# Patient Record
Sex: Male | Born: 1935 | Race: White | Hispanic: No | Smoking: Current every day smoker
Health system: Southern US, Community
[De-identification: ages and names within clinical notes are randomized; demographics above are authoritative.]

## PROBLEM LIST (undated history)

## (undated) DIAGNOSIS — I10 Essential (primary) hypertension: Secondary | ICD-10-CM

## (undated) DIAGNOSIS — C4490 Unspecified malignant neoplasm of skin, unspecified: Secondary | ICD-10-CM

## (undated) DIAGNOSIS — I70209 Unspecified atherosclerosis of native arteries of extremities, unspecified extremity: Secondary | ICD-10-CM

## (undated) DIAGNOSIS — E785 Hyperlipidemia, unspecified: Secondary | ICD-10-CM

## (undated) DIAGNOSIS — I6529 Occlusion and stenosis of unspecified carotid artery: Secondary | ICD-10-CM

## (undated) DIAGNOSIS — I119 Hypertensive heart disease without heart failure: Secondary | ICD-10-CM

## (undated) DIAGNOSIS — I739 Peripheral vascular disease, unspecified: Secondary | ICD-10-CM

## (undated) HISTORY — PX: CATARACT EXTRACTION: SUR2

## (undated) HISTORY — DX: Unspecified atherosclerosis of native arteries of extremities, unspecified extremity: I70.209

## (undated) HISTORY — DX: Unspecified malignant neoplasm of skin, unspecified: C44.90

## (undated) HISTORY — DX: Hyperlipidemia, unspecified: E78.5

## (undated) HISTORY — DX: Hypertensive heart disease without heart failure: I11.9

## (undated) HISTORY — DX: Peripheral vascular disease, unspecified: I73.9

## (undated) HISTORY — PX: INSERT / REPLACE / REMOVE PACEMAKER: SUR710

## (undated) HISTORY — PX: HERNIA REPAIR: SHX51

## (undated) HISTORY — DX: Occlusion and stenosis of unspecified carotid artery: I65.29

## (undated) HISTORY — DX: Essential (primary) hypertension: I10

## (undated) HISTORY — PX: PROSTATECTOMY: SHX69

## (undated) HISTORY — PX: APPENDECTOMY: SHX54

---

## 1998-09-23 ENCOUNTER — Ambulatory Visit (HOSPITAL_COMMUNITY): Admission: RE | Admit: 1998-09-23 | Discharge: 1998-09-23 | Payer: Self-pay | Admitting: Internal Medicine

## 2006-10-20 ENCOUNTER — Ambulatory Visit: Payer: Self-pay | Admitting: Oncology

## 2007-08-02 ENCOUNTER — Ambulatory Visit: Payer: Self-pay | Admitting: Internal Medicine

## 2007-08-02 DIAGNOSIS — R05 Cough: Secondary | ICD-10-CM | POA: Insufficient documentation

## 2007-08-02 DIAGNOSIS — C61 Malignant neoplasm of prostate: Secondary | ICD-10-CM

## 2007-08-02 DIAGNOSIS — J449 Chronic obstructive pulmonary disease, unspecified: Secondary | ICD-10-CM | POA: Insufficient documentation

## 2007-08-02 DIAGNOSIS — E785 Hyperlipidemia, unspecified: Secondary | ICD-10-CM | POA: Insufficient documentation

## 2007-08-15 ENCOUNTER — Telehealth (INDEPENDENT_AMBULATORY_CARE_PROVIDER_SITE_OTHER): Payer: Self-pay | Admitting: *Deleted

## 2007-08-17 ENCOUNTER — Telehealth: Payer: Self-pay | Admitting: Internal Medicine

## 2007-08-18 ENCOUNTER — Ambulatory Visit: Payer: Self-pay | Admitting: Internal Medicine

## 2007-09-23 ENCOUNTER — Ambulatory Visit: Payer: Self-pay | Admitting: Internal Medicine

## 2010-03-03 ENCOUNTER — Encounter: Payer: Self-pay | Admitting: Internal Medicine

## 2010-03-19 NOTE — Letter (Signed)
Summary: Arvin Collard II MD/[facility] MD/[facility]   Imported By: Lester Clayton 03/12/2010 07:29:07  _____________________________________________________________________  External Attachment:    Type:   Image     Comment:   External Document

## 2010-07-03 ENCOUNTER — Other Ambulatory Visit: Payer: Self-pay

## 2011-01-29 DIAGNOSIS — L570 Actinic keratosis: Secondary | ICD-10-CM | POA: Diagnosis not present

## 2011-02-17 DIAGNOSIS — C44711 Basal cell carcinoma of skin of unspecified lower limb, including hip: Secondary | ICD-10-CM | POA: Diagnosis not present

## 2011-03-17 DIAGNOSIS — I498 Other specified cardiac arrhythmias: Secondary | ICD-10-CM | POA: Diagnosis not present

## 2011-04-07 DIAGNOSIS — H353290 Exudative age-related macular degeneration, unspecified eye, stage unspecified: Secondary | ICD-10-CM | POA: Diagnosis not present

## 2011-04-20 DIAGNOSIS — I10 Essential (primary) hypertension: Secondary | ICD-10-CM | POA: Diagnosis not present

## 2011-04-20 DIAGNOSIS — J209 Acute bronchitis, unspecified: Secondary | ICD-10-CM | POA: Diagnosis not present

## 2011-04-22 DIAGNOSIS — E1149 Type 2 diabetes mellitus with other diabetic neurological complication: Secondary | ICD-10-CM | POA: Diagnosis not present

## 2011-04-22 DIAGNOSIS — L608 Other nail disorders: Secondary | ICD-10-CM | POA: Diagnosis not present

## 2011-04-23 DIAGNOSIS — C44711 Basal cell carcinoma of skin of unspecified lower limb, including hip: Secondary | ICD-10-CM | POA: Diagnosis not present

## 2011-04-23 DIAGNOSIS — L723 Sebaceous cyst: Secondary | ICD-10-CM | POA: Diagnosis not present

## 2011-04-23 DIAGNOSIS — L821 Other seborrheic keratosis: Secondary | ICD-10-CM | POA: Diagnosis not present

## 2011-05-05 DIAGNOSIS — C61 Malignant neoplasm of prostate: Secondary | ICD-10-CM | POA: Diagnosis not present

## 2011-05-11 DIAGNOSIS — Z79899 Other long term (current) drug therapy: Secondary | ICD-10-CM | POA: Diagnosis not present

## 2011-05-11 DIAGNOSIS — I10 Essential (primary) hypertension: Secondary | ICD-10-CM | POA: Diagnosis not present

## 2011-05-11 DIAGNOSIS — J449 Chronic obstructive pulmonary disease, unspecified: Secondary | ICD-10-CM | POA: Diagnosis not present

## 2011-05-11 DIAGNOSIS — R358 Other polyuria: Secondary | ICD-10-CM | POA: Diagnosis not present

## 2011-05-11 DIAGNOSIS — D510 Vitamin B12 deficiency anemia due to intrinsic factor deficiency: Secondary | ICD-10-CM | POA: Diagnosis not present

## 2011-05-13 DIAGNOSIS — C61 Malignant neoplasm of prostate: Secondary | ICD-10-CM | POA: Diagnosis not present

## 2011-05-13 DIAGNOSIS — L989 Disorder of the skin and subcutaneous tissue, unspecified: Secondary | ICD-10-CM | POA: Diagnosis not present

## 2011-05-13 DIAGNOSIS — L723 Sebaceous cyst: Secondary | ICD-10-CM | POA: Diagnosis not present

## 2011-05-13 DIAGNOSIS — Z85828 Personal history of other malignant neoplasm of skin: Secondary | ICD-10-CM | POA: Diagnosis not present

## 2011-05-19 DIAGNOSIS — H353290 Exudative age-related macular degeneration, unspecified eye, stage unspecified: Secondary | ICD-10-CM | POA: Diagnosis not present

## 2011-05-21 DIAGNOSIS — J029 Acute pharyngitis, unspecified: Secondary | ICD-10-CM | POA: Diagnosis not present

## 2011-05-25 DIAGNOSIS — L570 Actinic keratosis: Secondary | ICD-10-CM | POA: Diagnosis not present

## 2011-05-25 DIAGNOSIS — L0100 Impetigo, unspecified: Secondary | ICD-10-CM | POA: Diagnosis not present

## 2011-05-25 DIAGNOSIS — C44711 Basal cell carcinoma of skin of unspecified lower limb, including hip: Secondary | ICD-10-CM | POA: Diagnosis not present

## 2011-06-15 DIAGNOSIS — L570 Actinic keratosis: Secondary | ICD-10-CM | POA: Diagnosis not present

## 2011-06-15 DIAGNOSIS — R1900 Intra-abdominal and pelvic swelling, mass and lump, unspecified site: Secondary | ICD-10-CM | POA: Diagnosis not present

## 2011-06-15 DIAGNOSIS — K5900 Constipation, unspecified: Secondary | ICD-10-CM | POA: Diagnosis not present

## 2011-06-15 DIAGNOSIS — I10 Essential (primary) hypertension: Secondary | ICD-10-CM | POA: Diagnosis not present

## 2011-06-23 DIAGNOSIS — H353290 Exudative age-related macular degeneration, unspecified eye, stage unspecified: Secondary | ICD-10-CM | POA: Diagnosis not present

## 2011-06-25 DIAGNOSIS — E119 Type 2 diabetes mellitus without complications: Secondary | ICD-10-CM | POA: Diagnosis not present

## 2011-06-25 DIAGNOSIS — I10 Essential (primary) hypertension: Secondary | ICD-10-CM | POA: Diagnosis not present

## 2011-06-25 DIAGNOSIS — R1032 Left lower quadrant pain: Secondary | ICD-10-CM | POA: Diagnosis not present

## 2011-06-27 DIAGNOSIS — R7309 Other abnormal glucose: Secondary | ICD-10-CM | POA: Diagnosis not present

## 2011-07-09 DIAGNOSIS — Z950 Presence of cardiac pacemaker: Secondary | ICD-10-CM | POA: Diagnosis not present

## 2011-07-10 DIAGNOSIS — R221 Localized swelling, mass and lump, neck: Secondary | ICD-10-CM | POA: Diagnosis not present

## 2011-07-10 DIAGNOSIS — R220 Localized swelling, mass and lump, head: Secondary | ICD-10-CM | POA: Diagnosis not present

## 2011-07-13 DIAGNOSIS — I739 Peripheral vascular disease, unspecified: Secondary | ICD-10-CM | POA: Diagnosis not present

## 2011-07-13 DIAGNOSIS — I10 Essential (primary) hypertension: Secondary | ICD-10-CM | POA: Diagnosis not present

## 2011-07-13 DIAGNOSIS — Z950 Presence of cardiac pacemaker: Secondary | ICD-10-CM | POA: Diagnosis not present

## 2011-07-13 DIAGNOSIS — E785 Hyperlipidemia, unspecified: Secondary | ICD-10-CM | POA: Diagnosis not present

## 2011-07-21 DIAGNOSIS — H353290 Exudative age-related macular degeneration, unspecified eye, stage unspecified: Secondary | ICD-10-CM | POA: Diagnosis not present

## 2011-08-03 DIAGNOSIS — H5710 Ocular pain, unspecified eye: Secondary | ICD-10-CM | POA: Diagnosis not present

## 2011-08-06 DIAGNOSIS — L608 Other nail disorders: Secondary | ICD-10-CM | POA: Diagnosis not present

## 2011-08-06 DIAGNOSIS — E1149 Type 2 diabetes mellitus with other diabetic neurological complication: Secondary | ICD-10-CM | POA: Diagnosis not present

## 2011-08-25 DIAGNOSIS — H353290 Exudative age-related macular degeneration, unspecified eye, stage unspecified: Secondary | ICD-10-CM | POA: Diagnosis not present

## 2011-09-16 DIAGNOSIS — L570 Actinic keratosis: Secondary | ICD-10-CM | POA: Diagnosis not present

## 2011-09-16 DIAGNOSIS — C44319 Basal cell carcinoma of skin of other parts of face: Secondary | ICD-10-CM | POA: Diagnosis not present

## 2011-09-29 DIAGNOSIS — E119 Type 2 diabetes mellitus without complications: Secondary | ICD-10-CM | POA: Diagnosis not present

## 2011-10-06 DIAGNOSIS — H353290 Exudative age-related macular degeneration, unspecified eye, stage unspecified: Secondary | ICD-10-CM | POA: Diagnosis not present

## 2011-10-13 DIAGNOSIS — I499 Cardiac arrhythmia, unspecified: Secondary | ICD-10-CM | POA: Diagnosis not present

## 2011-11-03 DIAGNOSIS — H353290 Exudative age-related macular degeneration, unspecified eye, stage unspecified: Secondary | ICD-10-CM | POA: Diagnosis not present

## 2011-11-05 DIAGNOSIS — L538 Other specified erythematous conditions: Secondary | ICD-10-CM | POA: Diagnosis not present

## 2011-11-05 DIAGNOSIS — L570 Actinic keratosis: Secondary | ICD-10-CM | POA: Diagnosis not present

## 2011-11-05 DIAGNOSIS — L608 Other nail disorders: Secondary | ICD-10-CM | POA: Diagnosis not present

## 2011-11-05 DIAGNOSIS — C44621 Squamous cell carcinoma of skin of unspecified upper limb, including shoulder: Secondary | ICD-10-CM | POA: Diagnosis not present

## 2011-11-05 DIAGNOSIS — E1149 Type 2 diabetes mellitus with other diabetic neurological complication: Secondary | ICD-10-CM | POA: Diagnosis not present

## 2011-11-06 DIAGNOSIS — R7989 Other specified abnormal findings of blood chemistry: Secondary | ICD-10-CM | POA: Diagnosis not present

## 2011-11-10 DIAGNOSIS — C61 Malignant neoplasm of prostate: Secondary | ICD-10-CM | POA: Diagnosis not present

## 2011-11-16 DIAGNOSIS — Z09 Encounter for follow-up examination after completed treatment for conditions other than malignant neoplasm: Secondary | ICD-10-CM | POA: Diagnosis not present

## 2011-11-16 DIAGNOSIS — C61 Malignant neoplasm of prostate: Secondary | ICD-10-CM | POA: Diagnosis not present

## 2011-11-25 DIAGNOSIS — C44320 Squamous cell carcinoma of skin of unspecified parts of face: Secondary | ICD-10-CM | POA: Diagnosis not present

## 2011-11-25 DIAGNOSIS — L570 Actinic keratosis: Secondary | ICD-10-CM | POA: Diagnosis not present

## 2011-11-27 DIAGNOSIS — E785 Hyperlipidemia, unspecified: Secondary | ICD-10-CM | POA: Diagnosis not present

## 2011-11-30 DIAGNOSIS — Z950 Presence of cardiac pacemaker: Secondary | ICD-10-CM | POA: Diagnosis not present

## 2011-11-30 DIAGNOSIS — E785 Hyperlipidemia, unspecified: Secondary | ICD-10-CM | POA: Diagnosis not present

## 2011-11-30 DIAGNOSIS — I10 Essential (primary) hypertension: Secondary | ICD-10-CM | POA: Diagnosis not present

## 2011-11-30 DIAGNOSIS — I739 Peripheral vascular disease, unspecified: Secondary | ICD-10-CM | POA: Diagnosis not present

## 2011-12-07 DIAGNOSIS — C44621 Squamous cell carcinoma of skin of unspecified upper limb, including shoulder: Secondary | ICD-10-CM | POA: Diagnosis not present

## 2011-12-16 DIAGNOSIS — C44319 Basal cell carcinoma of skin of other parts of face: Secondary | ICD-10-CM | POA: Diagnosis not present

## 2011-12-22 DIAGNOSIS — H353290 Exudative age-related macular degeneration, unspecified eye, stage unspecified: Secondary | ICD-10-CM | POA: Diagnosis not present

## 2011-12-25 DIAGNOSIS — Z0000 Encounter for general adult medical examination without abnormal findings: Secondary | ICD-10-CM | POA: Diagnosis not present

## 2011-12-25 DIAGNOSIS — L989 Disorder of the skin and subcutaneous tissue, unspecified: Secondary | ICD-10-CM | POA: Diagnosis not present

## 2012-01-01 DIAGNOSIS — L570 Actinic keratosis: Secondary | ICD-10-CM | POA: Diagnosis not present

## 2012-01-08 DIAGNOSIS — E119 Type 2 diabetes mellitus without complications: Secondary | ICD-10-CM | POA: Diagnosis not present

## 2012-01-14 DIAGNOSIS — I499 Cardiac arrhythmia, unspecified: Secondary | ICD-10-CM | POA: Diagnosis not present

## 2012-02-04 DIAGNOSIS — G4700 Insomnia, unspecified: Secondary | ICD-10-CM | POA: Diagnosis not present

## 2012-02-04 DIAGNOSIS — Z79899 Other long term (current) drug therapy: Secondary | ICD-10-CM | POA: Diagnosis not present

## 2012-02-04 DIAGNOSIS — I10 Essential (primary) hypertension: Secondary | ICD-10-CM | POA: Diagnosis not present

## 2012-02-04 DIAGNOSIS — E119 Type 2 diabetes mellitus without complications: Secondary | ICD-10-CM | POA: Diagnosis not present

## 2012-02-08 DIAGNOSIS — L821 Other seborrheic keratosis: Secondary | ICD-10-CM | POA: Diagnosis not present

## 2012-02-08 DIAGNOSIS — L570 Actinic keratosis: Secondary | ICD-10-CM | POA: Diagnosis not present

## 2012-02-08 DIAGNOSIS — D1739 Benign lipomatous neoplasm of skin and subcutaneous tissue of other sites: Secondary | ICD-10-CM | POA: Diagnosis not present

## 2012-02-08 DIAGNOSIS — D539 Nutritional anemia, unspecified: Secondary | ICD-10-CM | POA: Diagnosis not present

## 2012-02-09 DIAGNOSIS — H353290 Exudative age-related macular degeneration, unspecified eye, stage unspecified: Secondary | ICD-10-CM | POA: Diagnosis not present

## 2012-02-11 DIAGNOSIS — H6120 Impacted cerumen, unspecified ear: Secondary | ICD-10-CM | POA: Diagnosis not present

## 2012-02-11 DIAGNOSIS — E1149 Type 2 diabetes mellitus with other diabetic neurological complication: Secondary | ICD-10-CM | POA: Diagnosis not present

## 2012-02-11 DIAGNOSIS — Z85828 Personal history of other malignant neoplasm of skin: Secondary | ICD-10-CM | POA: Diagnosis not present

## 2012-02-11 DIAGNOSIS — J329 Chronic sinusitis, unspecified: Secondary | ICD-10-CM | POA: Diagnosis not present

## 2012-02-11 DIAGNOSIS — J342 Deviated nasal septum: Secondary | ICD-10-CM | POA: Diagnosis not present

## 2012-02-11 DIAGNOSIS — R51 Headache: Secondary | ICD-10-CM | POA: Diagnosis not present

## 2012-02-11 DIAGNOSIS — L608 Other nail disorders: Secondary | ICD-10-CM | POA: Diagnosis not present

## 2012-02-20 DIAGNOSIS — R209 Unspecified disturbances of skin sensation: Secondary | ICD-10-CM | POA: Diagnosis not present

## 2012-02-20 DIAGNOSIS — IMO0002 Reserved for concepts with insufficient information to code with codable children: Secondary | ICD-10-CM | POA: Diagnosis not present

## 2012-02-20 DIAGNOSIS — M545 Low back pain: Secondary | ICD-10-CM | POA: Diagnosis not present

## 2012-02-20 DIAGNOSIS — R5381 Other malaise: Secondary | ICD-10-CM | POA: Diagnosis not present

## 2012-02-20 DIAGNOSIS — R5383 Other fatigue: Secondary | ICD-10-CM | POA: Diagnosis not present

## 2012-02-22 DIAGNOSIS — D649 Anemia, unspecified: Secondary | ICD-10-CM | POA: Diagnosis not present

## 2012-02-22 DIAGNOSIS — M5430 Sciatica, unspecified side: Secondary | ICD-10-CM | POA: Diagnosis not present

## 2012-03-01 DIAGNOSIS — M5430 Sciatica, unspecified side: Secondary | ICD-10-CM | POA: Diagnosis not present

## 2012-03-03 DIAGNOSIS — M5430 Sciatica, unspecified side: Secondary | ICD-10-CM | POA: Diagnosis not present

## 2012-03-08 DIAGNOSIS — R509 Fever, unspecified: Secondary | ICD-10-CM | POA: Diagnosis not present

## 2012-03-08 DIAGNOSIS — J449 Chronic obstructive pulmonary disease, unspecified: Secondary | ICD-10-CM | POA: Diagnosis not present

## 2012-03-08 DIAGNOSIS — E119 Type 2 diabetes mellitus without complications: Secondary | ICD-10-CM | POA: Diagnosis not present

## 2012-03-08 DIAGNOSIS — E7800 Pure hypercholesterolemia, unspecified: Secondary | ICD-10-CM | POA: Diagnosis not present

## 2012-03-08 DIAGNOSIS — R05 Cough: Secondary | ICD-10-CM | POA: Diagnosis not present

## 2012-03-08 DIAGNOSIS — I10 Essential (primary) hypertension: Secondary | ICD-10-CM | POA: Diagnosis not present

## 2012-03-08 DIAGNOSIS — J180 Bronchopneumonia, unspecified organism: Secondary | ICD-10-CM | POA: Diagnosis not present

## 2012-03-08 DIAGNOSIS — R0602 Shortness of breath: Secondary | ICD-10-CM | POA: Diagnosis not present

## 2012-03-16 DIAGNOSIS — D509 Iron deficiency anemia, unspecified: Secondary | ICD-10-CM | POA: Diagnosis not present

## 2012-03-16 DIAGNOSIS — R195 Other fecal abnormalities: Secondary | ICD-10-CM | POA: Diagnosis not present

## 2012-03-21 DIAGNOSIS — L570 Actinic keratosis: Secondary | ICD-10-CM | POA: Diagnosis not present

## 2012-03-21 DIAGNOSIS — L723 Sebaceous cyst: Secondary | ICD-10-CM | POA: Diagnosis not present

## 2012-03-24 DIAGNOSIS — M5430 Sciatica, unspecified side: Secondary | ICD-10-CM | POA: Diagnosis not present

## 2012-03-28 DIAGNOSIS — M5430 Sciatica, unspecified side: Secondary | ICD-10-CM | POA: Diagnosis not present

## 2012-04-05 DIAGNOSIS — H353290 Exudative age-related macular degeneration, unspecified eye, stage unspecified: Secondary | ICD-10-CM | POA: Diagnosis not present

## 2012-04-11 DIAGNOSIS — D509 Iron deficiency anemia, unspecified: Secondary | ICD-10-CM | POA: Diagnosis not present

## 2012-04-14 DIAGNOSIS — K2901 Acute gastritis with bleeding: Secondary | ICD-10-CM | POA: Diagnosis not present

## 2012-04-14 DIAGNOSIS — K2970 Gastritis, unspecified, without bleeding: Secondary | ICD-10-CM | POA: Diagnosis not present

## 2012-04-14 DIAGNOSIS — I498 Other specified cardiac arrhythmias: Secondary | ICD-10-CM | POA: Diagnosis not present

## 2012-04-14 DIAGNOSIS — R195 Other fecal abnormalities: Secondary | ICD-10-CM | POA: Diagnosis not present

## 2012-04-14 DIAGNOSIS — K2990 Gastroduodenitis, unspecified, without bleeding: Secondary | ICD-10-CM | POA: Diagnosis not present

## 2012-04-14 DIAGNOSIS — D500 Iron deficiency anemia secondary to blood loss (chronic): Secondary | ICD-10-CM | POA: Diagnosis not present

## 2012-04-26 DIAGNOSIS — M5430 Sciatica, unspecified side: Secondary | ICD-10-CM | POA: Diagnosis not present

## 2012-05-05 DIAGNOSIS — I2510 Atherosclerotic heart disease of native coronary artery without angina pectoris: Secondary | ICD-10-CM | POA: Diagnosis not present

## 2012-05-05 DIAGNOSIS — E119 Type 2 diabetes mellitus without complications: Secondary | ICD-10-CM | POA: Diagnosis not present

## 2012-05-05 DIAGNOSIS — I10 Essential (primary) hypertension: Secondary | ICD-10-CM | POA: Diagnosis not present

## 2012-05-05 DIAGNOSIS — L723 Sebaceous cyst: Secondary | ICD-10-CM | POA: Diagnosis not present

## 2012-05-10 DIAGNOSIS — H353290 Exudative age-related macular degeneration, unspecified eye, stage unspecified: Secondary | ICD-10-CM | POA: Diagnosis not present

## 2012-05-12 DIAGNOSIS — E1149 Type 2 diabetes mellitus with other diabetic neurological complication: Secondary | ICD-10-CM | POA: Diagnosis not present

## 2012-05-12 DIAGNOSIS — L608 Other nail disorders: Secondary | ICD-10-CM | POA: Diagnosis not present

## 2012-05-16 DIAGNOSIS — C61 Malignant neoplasm of prostate: Secondary | ICD-10-CM | POA: Diagnosis not present

## 2012-05-16 DIAGNOSIS — D649 Anemia, unspecified: Secondary | ICD-10-CM | POA: Diagnosis not present

## 2012-05-18 DIAGNOSIS — D649 Anemia, unspecified: Secondary | ICD-10-CM | POA: Diagnosis not present

## 2012-05-18 DIAGNOSIS — C61 Malignant neoplasm of prostate: Secondary | ICD-10-CM | POA: Diagnosis not present

## 2012-05-24 DIAGNOSIS — L723 Sebaceous cyst: Secondary | ICD-10-CM | POA: Diagnosis not present

## 2012-05-24 DIAGNOSIS — D219 Benign neoplasm of connective and other soft tissue, unspecified: Secondary | ICD-10-CM | POA: Diagnosis not present

## 2012-05-24 DIAGNOSIS — I2510 Atherosclerotic heart disease of native coronary artery without angina pectoris: Secondary | ICD-10-CM | POA: Diagnosis not present

## 2012-05-24 DIAGNOSIS — I10 Essential (primary) hypertension: Secondary | ICD-10-CM | POA: Diagnosis not present

## 2012-05-24 DIAGNOSIS — E119 Type 2 diabetes mellitus without complications: Secondary | ICD-10-CM | POA: Diagnosis not present

## 2012-05-26 DIAGNOSIS — E785 Hyperlipidemia, unspecified: Secondary | ICD-10-CM | POA: Diagnosis not present

## 2012-05-31 DIAGNOSIS — I6529 Occlusion and stenosis of unspecified carotid artery: Secondary | ICD-10-CM | POA: Diagnosis not present

## 2012-05-31 DIAGNOSIS — I119 Hypertensive heart disease without heart failure: Secondary | ICD-10-CM | POA: Diagnosis not present

## 2012-05-31 DIAGNOSIS — E785 Hyperlipidemia, unspecified: Secondary | ICD-10-CM | POA: Diagnosis not present

## 2012-05-31 DIAGNOSIS — Z79899 Other long term (current) drug therapy: Secondary | ICD-10-CM | POA: Diagnosis not present

## 2012-05-31 DIAGNOSIS — E119 Type 2 diabetes mellitus without complications: Secondary | ICD-10-CM | POA: Diagnosis not present

## 2012-05-31 DIAGNOSIS — I739 Peripheral vascular disease, unspecified: Secondary | ICD-10-CM | POA: Diagnosis not present

## 2012-05-31 DIAGNOSIS — I10 Essential (primary) hypertension: Secondary | ICD-10-CM | POA: Diagnosis not present

## 2012-06-15 DIAGNOSIS — H547 Unspecified visual loss: Secondary | ICD-10-CM | POA: Diagnosis not present

## 2012-06-21 DIAGNOSIS — H353290 Exudative age-related macular degeneration, unspecified eye, stage unspecified: Secondary | ICD-10-CM | POA: Diagnosis not present

## 2012-06-23 DIAGNOSIS — E782 Mixed hyperlipidemia: Secondary | ICD-10-CM | POA: Diagnosis not present

## 2012-06-23 DIAGNOSIS — Z79899 Other long term (current) drug therapy: Secondary | ICD-10-CM | POA: Diagnosis not present

## 2012-06-27 DIAGNOSIS — L723 Sebaceous cyst: Secondary | ICD-10-CM | POA: Diagnosis not present

## 2012-06-27 DIAGNOSIS — L821 Other seborrheic keratosis: Secondary | ICD-10-CM | POA: Diagnosis not present

## 2012-06-27 DIAGNOSIS — L570 Actinic keratosis: Secondary | ICD-10-CM | POA: Diagnosis not present

## 2012-06-28 DIAGNOSIS — B351 Tinea unguium: Secondary | ICD-10-CM | POA: Diagnosis not present

## 2012-06-28 DIAGNOSIS — E119 Type 2 diabetes mellitus without complications: Secondary | ICD-10-CM | POA: Diagnosis not present

## 2012-06-28 DIAGNOSIS — I119 Hypertensive heart disease without heart failure: Secondary | ICD-10-CM | POA: Diagnosis not present

## 2012-06-28 DIAGNOSIS — E782 Mixed hyperlipidemia: Secondary | ICD-10-CM | POA: Diagnosis not present

## 2012-06-30 DIAGNOSIS — Z006 Encounter for examination for normal comparison and control in clinical research program: Secondary | ICD-10-CM | POA: Diagnosis not present

## 2012-06-30 DIAGNOSIS — E119 Type 2 diabetes mellitus without complications: Secondary | ICD-10-CM | POA: Diagnosis not present

## 2012-06-30 DIAGNOSIS — Z7902 Long term (current) use of antithrombotics/antiplatelets: Secondary | ICD-10-CM | POA: Diagnosis not present

## 2012-06-30 DIAGNOSIS — I2584 Coronary atherosclerosis due to calcified coronary lesion: Secondary | ICD-10-CM | POA: Diagnosis not present

## 2012-07-15 DIAGNOSIS — J309 Allergic rhinitis, unspecified: Secondary | ICD-10-CM | POA: Diagnosis not present

## 2012-07-21 DIAGNOSIS — I442 Atrioventricular block, complete: Secondary | ICD-10-CM | POA: Diagnosis not present

## 2012-07-21 DIAGNOSIS — I428 Other cardiomyopathies: Secondary | ICD-10-CM | POA: Diagnosis not present

## 2012-07-21 DIAGNOSIS — Z950 Presence of cardiac pacemaker: Secondary | ICD-10-CM | POA: Diagnosis not present

## 2012-08-09 DIAGNOSIS — H353290 Exudative age-related macular degeneration, unspecified eye, stage unspecified: Secondary | ICD-10-CM | POA: Diagnosis not present

## 2012-08-15 DIAGNOSIS — L570 Actinic keratosis: Secondary | ICD-10-CM | POA: Diagnosis not present

## 2012-08-16 DIAGNOSIS — E1159 Type 2 diabetes mellitus with other circulatory complications: Secondary | ICD-10-CM | POA: Diagnosis not present

## 2012-08-16 DIAGNOSIS — B351 Tinea unguium: Secondary | ICD-10-CM | POA: Diagnosis not present

## 2012-08-16 DIAGNOSIS — I70209 Unspecified atherosclerosis of native arteries of extremities, unspecified extremity: Secondary | ICD-10-CM | POA: Diagnosis not present

## 2012-08-18 DIAGNOSIS — J449 Chronic obstructive pulmonary disease, unspecified: Secondary | ICD-10-CM | POA: Diagnosis not present

## 2012-08-23 DIAGNOSIS — J069 Acute upper respiratory infection, unspecified: Secondary | ICD-10-CM | POA: Diagnosis not present

## 2012-09-14 DIAGNOSIS — J0100 Acute maxillary sinusitis, unspecified: Secondary | ICD-10-CM | POA: Diagnosis not present

## 2012-09-20 DIAGNOSIS — H353290 Exudative age-related macular degeneration, unspecified eye, stage unspecified: Secondary | ICD-10-CM | POA: Diagnosis not present

## 2012-10-07 DIAGNOSIS — J329 Chronic sinusitis, unspecified: Secondary | ICD-10-CM | POA: Diagnosis not present

## 2012-10-25 DIAGNOSIS — I495 Sick sinus syndrome: Secondary | ICD-10-CM | POA: Diagnosis not present

## 2012-11-07 DIAGNOSIS — J329 Chronic sinusitis, unspecified: Secondary | ICD-10-CM | POA: Diagnosis not present

## 2012-11-08 DIAGNOSIS — H353290 Exudative age-related macular degeneration, unspecified eye, stage unspecified: Secondary | ICD-10-CM | POA: Diagnosis not present

## 2012-11-14 DIAGNOSIS — C61 Malignant neoplasm of prostate: Secondary | ICD-10-CM | POA: Diagnosis not present

## 2012-11-14 DIAGNOSIS — D509 Iron deficiency anemia, unspecified: Secondary | ICD-10-CM | POA: Diagnosis not present

## 2012-11-16 DIAGNOSIS — C61 Malignant neoplasm of prostate: Secondary | ICD-10-CM | POA: Diagnosis not present

## 2012-11-16 DIAGNOSIS — D509 Iron deficiency anemia, unspecified: Secondary | ICD-10-CM | POA: Diagnosis not present

## 2012-11-29 DIAGNOSIS — R233 Spontaneous ecchymoses: Secondary | ICD-10-CM | POA: Diagnosis not present

## 2012-11-29 DIAGNOSIS — L820 Inflamed seborrheic keratosis: Secondary | ICD-10-CM | POA: Diagnosis not present

## 2012-11-29 DIAGNOSIS — L723 Sebaceous cyst: Secondary | ICD-10-CM | POA: Diagnosis not present

## 2012-11-30 DIAGNOSIS — E119 Type 2 diabetes mellitus without complications: Secondary | ICD-10-CM | POA: Diagnosis not present

## 2012-11-30 DIAGNOSIS — E785 Hyperlipidemia, unspecified: Secondary | ICD-10-CM | POA: Diagnosis not present

## 2012-11-30 DIAGNOSIS — Z79899 Other long term (current) drug therapy: Secondary | ICD-10-CM | POA: Diagnosis not present

## 2012-12-01 DIAGNOSIS — I70209 Unspecified atherosclerosis of native arteries of extremities, unspecified extremity: Secondary | ICD-10-CM | POA: Diagnosis not present

## 2012-12-01 DIAGNOSIS — E1159 Type 2 diabetes mellitus with other circulatory complications: Secondary | ICD-10-CM | POA: Diagnosis not present

## 2012-12-01 DIAGNOSIS — B351 Tinea unguium: Secondary | ICD-10-CM | POA: Diagnosis not present

## 2012-12-05 DIAGNOSIS — I10 Essential (primary) hypertension: Secondary | ICD-10-CM | POA: Diagnosis not present

## 2012-12-05 DIAGNOSIS — E119 Type 2 diabetes mellitus without complications: Secondary | ICD-10-CM | POA: Diagnosis not present

## 2012-12-05 DIAGNOSIS — Z950 Presence of cardiac pacemaker: Secondary | ICD-10-CM | POA: Diagnosis not present

## 2012-12-05 DIAGNOSIS — I6529 Occlusion and stenosis of unspecified carotid artery: Secondary | ICD-10-CM | POA: Diagnosis not present

## 2012-12-05 DIAGNOSIS — E785 Hyperlipidemia, unspecified: Secondary | ICD-10-CM | POA: Diagnosis not present

## 2012-12-05 DIAGNOSIS — I739 Peripheral vascular disease, unspecified: Secondary | ICD-10-CM | POA: Diagnosis not present

## 2012-12-05 DIAGNOSIS — Z79899 Other long term (current) drug therapy: Secondary | ICD-10-CM | POA: Diagnosis not present

## 2012-12-20 DIAGNOSIS — G909 Disorder of the autonomic nervous system, unspecified: Secondary | ICD-10-CM | POA: Diagnosis not present

## 2012-12-20 DIAGNOSIS — Z0000 Encounter for general adult medical examination without abnormal findings: Secondary | ICD-10-CM | POA: Diagnosis not present

## 2012-12-20 DIAGNOSIS — I10 Essential (primary) hypertension: Secondary | ICD-10-CM | POA: Diagnosis not present

## 2012-12-20 DIAGNOSIS — E119 Type 2 diabetes mellitus without complications: Secondary | ICD-10-CM | POA: Diagnosis not present

## 2012-12-20 DIAGNOSIS — E1149 Type 2 diabetes mellitus with other diabetic neurological complication: Secondary | ICD-10-CM | POA: Diagnosis not present

## 2013-01-05 DIAGNOSIS — D539 Nutritional anemia, unspecified: Secondary | ICD-10-CM | POA: Diagnosis not present

## 2013-01-05 DIAGNOSIS — K5900 Constipation, unspecified: Secondary | ICD-10-CM | POA: Diagnosis not present

## 2013-01-05 DIAGNOSIS — R109 Unspecified abdominal pain: Secondary | ICD-10-CM | POA: Diagnosis not present

## 2013-01-24 DIAGNOSIS — R195 Other fecal abnormalities: Secondary | ICD-10-CM | POA: Diagnosis not present

## 2013-01-24 DIAGNOSIS — D500 Iron deficiency anemia secondary to blood loss (chronic): Secondary | ICD-10-CM | POA: Diagnosis not present

## 2013-01-24 DIAGNOSIS — Z86010 Personal history of colonic polyps: Secondary | ICD-10-CM | POA: Diagnosis not present

## 2013-01-24 DIAGNOSIS — K2901 Acute gastritis with bleeding: Secondary | ICD-10-CM | POA: Diagnosis not present

## 2013-01-31 DIAGNOSIS — I498 Other specified cardiac arrhythmias: Secondary | ICD-10-CM | POA: Diagnosis not present

## 2013-02-07 DIAGNOSIS — H353290 Exudative age-related macular degeneration, unspecified eye, stage unspecified: Secondary | ICD-10-CM | POA: Diagnosis not present

## 2013-02-21 DIAGNOSIS — D509 Iron deficiency anemia, unspecified: Secondary | ICD-10-CM | POA: Diagnosis not present

## 2013-02-23 DIAGNOSIS — R195 Other fecal abnormalities: Secondary | ICD-10-CM | POA: Diagnosis not present

## 2013-03-02 DIAGNOSIS — C44319 Basal cell carcinoma of skin of other parts of face: Secondary | ICD-10-CM | POA: Diagnosis not present

## 2013-03-02 DIAGNOSIS — L570 Actinic keratosis: Secondary | ICD-10-CM | POA: Diagnosis not present

## 2013-03-07 DIAGNOSIS — H353290 Exudative age-related macular degeneration, unspecified eye, stage unspecified: Secondary | ICD-10-CM | POA: Diagnosis not present

## 2013-03-10 DIAGNOSIS — J329 Chronic sinusitis, unspecified: Secondary | ICD-10-CM | POA: Diagnosis not present

## 2013-03-13 DIAGNOSIS — I119 Hypertensive heart disease without heart failure: Secondary | ICD-10-CM | POA: Diagnosis not present

## 2013-03-13 DIAGNOSIS — J40 Bronchitis, not specified as acute or chronic: Secondary | ICD-10-CM | POA: Diagnosis not present

## 2013-03-22 DIAGNOSIS — Z86010 Personal history of colonic polyps: Secondary | ICD-10-CM | POA: Diagnosis not present

## 2013-03-22 DIAGNOSIS — R195 Other fecal abnormalities: Secondary | ICD-10-CM | POA: Diagnosis not present

## 2013-03-22 DIAGNOSIS — K5730 Diverticulosis of large intestine without perforation or abscess without bleeding: Secondary | ICD-10-CM | POA: Diagnosis not present

## 2013-03-22 DIAGNOSIS — K5521 Angiodysplasia of colon with hemorrhage: Secondary | ICD-10-CM | POA: Diagnosis not present

## 2013-03-22 DIAGNOSIS — B3781 Candidal esophagitis: Secondary | ICD-10-CM | POA: Diagnosis not present

## 2013-04-01 DIAGNOSIS — C44319 Basal cell carcinoma of skin of other parts of face: Secondary | ICD-10-CM | POA: Diagnosis not present

## 2013-04-03 DIAGNOSIS — I10 Essential (primary) hypertension: Secondary | ICD-10-CM | POA: Diagnosis not present

## 2013-04-03 DIAGNOSIS — R109 Unspecified abdominal pain: Secondary | ICD-10-CM | POA: Diagnosis not present

## 2013-04-03 DIAGNOSIS — R6889 Other general symptoms and signs: Secondary | ICD-10-CM | POA: Diagnosis not present

## 2013-04-04 DIAGNOSIS — B351 Tinea unguium: Secondary | ICD-10-CM | POA: Diagnosis not present

## 2013-04-04 DIAGNOSIS — K5900 Constipation, unspecified: Secondary | ICD-10-CM | POA: Diagnosis not present

## 2013-04-04 DIAGNOSIS — E1159 Type 2 diabetes mellitus with other circulatory complications: Secondary | ICD-10-CM | POA: Diagnosis not present

## 2013-04-04 DIAGNOSIS — I70209 Unspecified atherosclerosis of native arteries of extremities, unspecified extremity: Secondary | ICD-10-CM | POA: Diagnosis not present

## 2013-04-18 DIAGNOSIS — H353290 Exudative age-related macular degeneration, unspecified eye, stage unspecified: Secondary | ICD-10-CM | POA: Diagnosis not present

## 2013-05-01 DIAGNOSIS — J309 Allergic rhinitis, unspecified: Secondary | ICD-10-CM | POA: Diagnosis not present

## 2013-05-01 DIAGNOSIS — K219 Gastro-esophageal reflux disease without esophagitis: Secondary | ICD-10-CM | POA: Diagnosis not present

## 2013-05-01 DIAGNOSIS — J441 Chronic obstructive pulmonary disease with (acute) exacerbation: Secondary | ICD-10-CM | POA: Diagnosis not present

## 2013-05-02 DIAGNOSIS — I498 Other specified cardiac arrhythmias: Secondary | ICD-10-CM | POA: Diagnosis not present

## 2013-05-15 DIAGNOSIS — C61 Malignant neoplasm of prostate: Secondary | ICD-10-CM | POA: Diagnosis not present

## 2013-05-15 DIAGNOSIS — D509 Iron deficiency anemia, unspecified: Secondary | ICD-10-CM | POA: Diagnosis not present

## 2013-05-17 DIAGNOSIS — D509 Iron deficiency anemia, unspecified: Secondary | ICD-10-CM | POA: Diagnosis not present

## 2013-05-17 DIAGNOSIS — C61 Malignant neoplasm of prostate: Secondary | ICD-10-CM | POA: Diagnosis not present

## 2013-05-19 ENCOUNTER — Ambulatory Visit (INDEPENDENT_AMBULATORY_CARE_PROVIDER_SITE_OTHER)
Admission: RE | Admit: 2013-05-19 | Discharge: 2013-05-19 | Disposition: A | Discharge status: Home / Self Care | Payer: Medicare Other | Source: Ambulatory Visit | Attending: Internal Medicine | Admitting: Internal Medicine

## 2013-05-19 ENCOUNTER — Telehealth: Payer: Self-pay | Admitting: Internal Medicine

## 2013-05-19 ENCOUNTER — Encounter: Payer: Self-pay | Admitting: Internal Medicine

## 2013-05-19 ENCOUNTER — Ambulatory Visit (INDEPENDENT_AMBULATORY_CARE_PROVIDER_SITE_OTHER): Payer: Medicare Other | Admitting: Internal Medicine

## 2013-05-19 VITALS — BP 122/74 | HR 89 | Temp 97.6°F | Ht 73.5 in | Wt 245.0 lb

## 2013-05-19 DIAGNOSIS — J4489 Other specified chronic obstructive pulmonary disease: Secondary | ICD-10-CM

## 2013-05-19 DIAGNOSIS — J449 Chronic obstructive pulmonary disease, unspecified: Secondary | ICD-10-CM

## 2013-05-19 MED ORDER — FLUTICASONE-SALMETEROL 250-50 MCG/DOSE IN AEPB: 1.0000 | INHALATION_SPRAY | Freq: Two times a day (BID) | RESPIRATORY_TRACT | Status: DC

## 2013-05-19 NOTE — Progress Notes (Signed)
Quick Note:  Spoke with pt and notified of results per Dr. Wert. Pt verbalized understanding and denied any questions.  ______ 

## 2013-05-19 NOTE — Patient Instructions (Addendum)
Continue advair and try off spiriva (if less exercise tolerance then restart)  Please remember to go to the x-ray department downstairs for your tests - we will call you with the results when they are available.    Please schedule a follow up office visit in 6 weeks, call sooner if needed pfts on return

## 2013-05-19 NOTE — Telephone Encounter (Signed)
Opened in error.  Thomas Floyd

## 2013-05-19 NOTE — Assessment & Plan Note (Signed)
When respiratory symptoms begin or become refractory well after a patient reports complete smoking cessation,  Especially when this wasn't the case while they were smoking, a red flag is raised based on the work of Dr Kris Mouton which states:  if you quit smoking when your best day FEV1 is still well preserved it is highly unlikely you will progress to severe disease.  That is to say, once the smoking stops,  the symptoms should not suddenly erupt or markedly worsen.  If so, the differential diagnosis should include  obesity/deconditioning,  LPR/Reflux/Aspiration syndromes,  occult CHF, or  especially side effect of medications commonly used in this population (like ACEi which previously eliminated his symptoms and no longer using)   In his case he's gone no meds needed to now that's he's stopped smoking to where he is on triple combo but doing much better p ranitidine added by Dr Lin Landsman so now the question is how to keep him better on the fewest number of meds   rec trial off spiriva and return for pfts/ continue Acid suppression, esp given his previous response to stopping acei   See instructions for specific recommendations which were reviewed directly with the patient who was given a copy with highlighter outlining the key components.

## 2013-05-19 NOTE — Progress Notes (Signed)
Subjective:    Patient ID: Thomas Floyd, male    DOB: April 17, 1927 MRN: [id number]  HPI   82  yowm quit smoking 04/2012  PFTs 09/20/07 FEV1 73% with ratio 48% so GOLD II at baseline.   Seen 08/04/2007  on ACEi, changed diovan, much better in general but no improvement in cough rattling was some better on spiriva previously but only used a few as needed. Still smoking. Mucinex helps when he remembers to take it.  rec trial off ACEi   September 23, 2007  completely better to his satisfaction with shortness of breath only with exertion and no nocturnal or early morning exacerbation of cough. he attributes the improvement in his symptoms to the addition of PPI by his primary physician and presently takes it at bedtime  rec Stop smoking  No need for rx at this point   05/19/2013 1st [facility] office visit/ Wert off cigs x 04/2012 Chief Complaint  Patient presents with  . Pulmonary Consult    Pt seen here back in 2009.  He was referred back per Dr. Lovette Cliche for eval of cough. He states that the cough does not really bother him- non prod and only occ notices this. His breathing is doing well.   has been maintained on spiriva x sev years  Then added advair recently  and much better with addition also of ranitidine 300 mg at hs  By Dr Lin Landsman and Not limited by breathing from desired activities    No obvious day to day or daytime variabilty or assoc chronic cough or cp or chest tightness, subjective wheeze overt sinus or hb symptoms. No unusual exp hx or h/o childhood pna/ asthma or knowledge of premature birth.  Sleeping ok without nocturnal  or early am exacerbation  of respiratory  c/o's or need for noct saba. Also denies any obvious fluctuation of symptoms with weather or environmental changes or other aggravating or alleviating factors except as outlined above   Current Medications, Allergies, Complete Past Medical History, Past Surgical History, Family History, and Social History were  reviewed in [employer] record.             Review of Systems  Constitutional: Negative for fever, chills, activity change, appetite change and unexpected weight change.  HENT: Negative for congestion, dental problem, postnasal drip, rhinorrhea, sneezing, sore throat, trouble swallowing and voice change.   Eyes: Negative for visual disturbance.  Respiratory: Positive for cough. Negative for choking and shortness of breath.   Cardiovascular: Negative for chest pain and leg swelling.  Gastrointestinal: Negative for nausea, vomiting and abdominal pain.  Genitourinary: Negative for difficulty urinating.  Musculoskeletal: Negative for arthralgias.  Skin: Negative for rash.  Psychiatric/Behavioral: Negative for behavioral problems and confusion.       Objective:   Physical Exam  pleasant stoic ambulatory white male in no acute distress, minimal pseudowheeze resolves with purse lip maneuver   Wt Readings from Last 3 Encounters:  05/19/13 245 lb (111.131 kg)  01/02/09 233 lb (105.688 kg)  09/23/07 231 lb (104.781 kg)       HEENT mild turbinate edema. Oropharynx no thrush or excess pnd or cobblestoning. No JVD or cervical adenopathy. Mild accessory muscle hypertrophy. Trachea midline, nl thryroid. Chest was hyperinflated by percussion with diminished breath sounds and moderate increased exp time with a few expiratory and expiratory rhonchi but no cough reproduced on expiratory or expiratory maneuvers. Hoover sign positive at mid inspiration. Regular rate and rhythm without  murmur gallop or rub or increase P2. Abd: no hsm, nl excursion. Ext warm without C,C or E.   CXR  05/19/2013 :  Stable chest with findings of chronic obstructive pulmonary disease. No acute cardiopulmonary process.      Assessment & Plan:

## 2013-05-19 NOTE — Progress Notes (Signed)
Quick Note:  LMTCB ______ 

## 2013-05-22 DIAGNOSIS — Z79899 Other long term (current) drug therapy: Secondary | ICD-10-CM | POA: Diagnosis not present

## 2013-05-24 DIAGNOSIS — L259 Unspecified contact dermatitis, unspecified cause: Secondary | ICD-10-CM | POA: Diagnosis not present

## 2013-05-24 DIAGNOSIS — L981 Factitial dermatitis: Secondary | ICD-10-CM | POA: Diagnosis not present

## 2013-05-24 DIAGNOSIS — L570 Actinic keratosis: Secondary | ICD-10-CM | POA: Diagnosis not present

## 2013-05-29 DIAGNOSIS — I10 Essential (primary) hypertension: Secondary | ICD-10-CM | POA: Diagnosis not present

## 2013-05-29 DIAGNOSIS — Z79899 Other long term (current) drug therapy: Secondary | ICD-10-CM | POA: Diagnosis not present

## 2013-05-29 DIAGNOSIS — E785 Hyperlipidemia, unspecified: Secondary | ICD-10-CM | POA: Diagnosis not present

## 2013-05-29 DIAGNOSIS — I739 Peripheral vascular disease, unspecified: Secondary | ICD-10-CM | POA: Diagnosis not present

## 2013-05-29 DIAGNOSIS — R42 Dizziness and giddiness: Secondary | ICD-10-CM | POA: Diagnosis not present

## 2013-05-30 DIAGNOSIS — E785 Hyperlipidemia, unspecified: Secondary | ICD-10-CM | POA: Diagnosis not present

## 2013-05-30 DIAGNOSIS — I739 Peripheral vascular disease, unspecified: Secondary | ICD-10-CM | POA: Diagnosis not present

## 2013-05-30 DIAGNOSIS — I10 Essential (primary) hypertension: Secondary | ICD-10-CM | POA: Diagnosis not present

## 2013-05-30 DIAGNOSIS — Z950 Presence of cardiac pacemaker: Secondary | ICD-10-CM | POA: Diagnosis not present

## 2013-05-30 DIAGNOSIS — I119 Hypertensive heart disease without heart failure: Secondary | ICD-10-CM | POA: Diagnosis not present

## 2013-05-30 DIAGNOSIS — E119 Type 2 diabetes mellitus without complications: Secondary | ICD-10-CM | POA: Diagnosis not present

## 2013-05-30 DIAGNOSIS — I6529 Occlusion and stenosis of unspecified carotid artery: Secondary | ICD-10-CM | POA: Diagnosis not present

## 2013-05-31 DIAGNOSIS — H353290 Exudative age-related macular degeneration, unspecified eye, stage unspecified: Secondary | ICD-10-CM | POA: Diagnosis not present

## 2013-06-02 ENCOUNTER — Telehealth: Payer: Self-pay | Admitting: Internal Medicine

## 2013-06-02 DIAGNOSIS — J4489 Other specified chronic obstructive pulmonary disease: Secondary | ICD-10-CM | POA: Diagnosis not present

## 2013-06-02 DIAGNOSIS — R42 Dizziness and giddiness: Secondary | ICD-10-CM | POA: Diagnosis not present

## 2013-06-02 DIAGNOSIS — Z79899 Other long term (current) drug therapy: Secondary | ICD-10-CM | POA: Diagnosis not present

## 2013-06-02 DIAGNOSIS — Z7902 Long term (current) use of antithrombotics/antiplatelets: Secondary | ICD-10-CM | POA: Diagnosis not present

## 2013-06-02 DIAGNOSIS — J449 Chronic obstructive pulmonary disease, unspecified: Secondary | ICD-10-CM | POA: Diagnosis not present

## 2013-06-02 DIAGNOSIS — E119 Type 2 diabetes mellitus without complications: Secondary | ICD-10-CM | POA: Diagnosis not present

## 2013-06-02 DIAGNOSIS — I10 Essential (primary) hypertension: Secondary | ICD-10-CM | POA: Diagnosis not present

## 2013-06-02 DIAGNOSIS — R5381 Other malaise: Secondary | ICD-10-CM | POA: Diagnosis not present

## 2013-06-02 DIAGNOSIS — E7800 Pure hypercholesterolemia, unspecified: Secondary | ICD-10-CM | POA: Diagnosis not present

## 2013-06-02 DIAGNOSIS — R5383 Other fatigue: Secondary | ICD-10-CM | POA: Diagnosis not present

## 2013-06-02 DIAGNOSIS — G40909 Epilepsy, unspecified, not intractable, without status epilepticus: Secondary | ICD-10-CM | POA: Diagnosis not present

## 2013-06-02 MED ORDER — RANITIDINE HCL 300 MG PO TABS: 300.0000 mg | ORAL_TABLET | Freq: Every day | ORAL | Status: DC

## 2013-06-02 NOTE — Telephone Encounter (Signed)
I called spoke w/ pt. Aware RX has been sent. Nothing further needed

## 2013-06-09 DIAGNOSIS — E119 Type 2 diabetes mellitus without complications: Secondary | ICD-10-CM | POA: Diagnosis not present

## 2013-06-09 DIAGNOSIS — Z0000 Encounter for general adult medical examination without abnormal findings: Secondary | ICD-10-CM | POA: Diagnosis not present

## 2013-06-09 DIAGNOSIS — Z79899 Other long term (current) drug therapy: Secondary | ICD-10-CM | POA: Diagnosis not present

## 2013-06-09 DIAGNOSIS — I10 Essential (primary) hypertension: Secondary | ICD-10-CM | POA: Diagnosis not present

## 2013-06-13 DIAGNOSIS — I739 Peripheral vascular disease, unspecified: Secondary | ICD-10-CM | POA: Diagnosis not present

## 2013-06-13 DIAGNOSIS — Z23 Encounter for immunization: Secondary | ICD-10-CM | POA: Diagnosis not present

## 2013-06-13 DIAGNOSIS — E782 Mixed hyperlipidemia: Secondary | ICD-10-CM | POA: Diagnosis not present

## 2013-06-13 DIAGNOSIS — I10 Essential (primary) hypertension: Secondary | ICD-10-CM | POA: Diagnosis not present

## 2013-06-20 DIAGNOSIS — R195 Other fecal abnormalities: Secondary | ICD-10-CM | POA: Diagnosis not present

## 2013-06-20 DIAGNOSIS — D500 Iron deficiency anemia secondary to blood loss (chronic): Secondary | ICD-10-CM | POA: Diagnosis not present

## 2013-06-22 DIAGNOSIS — D500 Iron deficiency anemia secondary to blood loss (chronic): Secondary | ICD-10-CM | POA: Diagnosis not present

## 2013-06-22 DIAGNOSIS — R195 Other fecal abnormalities: Secondary | ICD-10-CM | POA: Diagnosis not present

## 2013-06-27 DIAGNOSIS — I70209 Unspecified atherosclerosis of native arteries of extremities, unspecified extremity: Secondary | ICD-10-CM | POA: Diagnosis not present

## 2013-06-30 ENCOUNTER — Ambulatory Visit (INDEPENDENT_AMBULATORY_CARE_PROVIDER_SITE_OTHER): Payer: Medicare Other | Admitting: Internal Medicine

## 2013-06-30 ENCOUNTER — Encounter: Payer: Self-pay | Admitting: Internal Medicine

## 2013-06-30 VITALS — BP 104/60 | HR 98 | Temp 98.7°F | Ht 72.0 in | Wt 242.0 lb

## 2013-06-30 DIAGNOSIS — J449 Chronic obstructive pulmonary disease, unspecified: Secondary | ICD-10-CM

## 2013-06-30 MED ORDER — FLUTICASONE-SALMETEROL 250-50 MCG/DOSE IN AEPB: 1.0000 | INHALATION_SPRAY | Freq: Two times a day (BID) | RESPIRATORY_TRACT | Status: DC

## 2013-06-30 MED ORDER — ALBUTEROL SULFATE HFA 108 (90 BASE) MCG/ACT IN AERS: INHALATION_SPRAY | RESPIRATORY_TRACT | Status: DC

## 2013-06-30 NOTE — Progress Notes (Addendum)
Subjective:    Patient ID: Thomas Floyd, male    DOB: Jan 20, 1928 MRN: [id number]     Brief patient profile:  85  yowm quit smoking 04/2012  PFTs 09/20/07 FEV1 73% with ratio 48% so GOLD II at baseline.    History of Present Illness  Seen 08/04/2007  on ACEi, changed diovan, much better in general but no improvement in cough rattling was some better on spiriva previously but only used a few as needed. Still smoking. Mucinex helps when he remembers to take it.  rec trial off ACEi   September 23, 2007  completely better to his satisfaction with shortness of breath only with exertion and no nocturnal or early morning exacerbation of cough. he attributes the improvement in his symptoms to the addition of PPI by his primary physician and presently takes it at bedtime  rec Stop smoking  No need for rx at this point   05/19/2013 1st [facility] office visit/ Thomas Floyd off cigs x 04/2012 Chief Complaint  Patient presents with  . Pulmonary Consult    Pt seen here back in 2009.  He was referred back per Dr. Lovette Cliche for eval of cough. He states that the cough does not really bother him- non prod and only occ notices this. His breathing is doing well.   has been maintained on spiriva x sev years  Then added advair recently  and much better with addition also of ranitidine 300 mg at hs  By Dr Lin Landsman and Not limited by breathing from desired activities   rec Continue advair and try off spiriva (if less exercise tolerance then restart    06/30/2013 f/u ov/Thomas Floyd re: COPD GOLD II on advair 250 one bid doing better x for hands cramping maybe once a day.  Not limited by breathing from desired activities  No obvious day to day or daytime variabilty or assoc chronic cough or cp or chest tightness, subjective wheeze overt sinus or hb symptoms. No unusual exp hx or h/o childhood pna/ asthma or knowledge of premature birth.  Sleeping ok without nocturnal  or early am exacerbation  of respiratory  c/o's or  need for noct saba. Also denies any obvious fluctuation of symptoms with weather or environmental changes or other aggravating or alleviating factors except as outlined above   Current Medications, Allergies, Complete Past Medical History, Past Surgical History, Family History, and Social History were reviewed in [employer] record.  ROS  The following are not active complaints unless bolded sore throat, dysphagia, dental problems, itching, sneezing,  nasal congestion or excess/ purulent secretions, ear ache,   fever, chills, sweats, unintended wt loss, pleuritic or exertional cp, hemoptysis,  orthopnea pnd or leg swelling, presyncope, palpitations, heartburn, abdominal pain, anorexia, nausea, vomiting, diarrhea  or change in bowel or urinary habits, change in stools or urine, dysuria,hematuria,  rash, arthralgias, visual complaints, headache, numbness weakness or ataxia or problems with walking or coordination,  change in mood/affect or memory.         Objective:   Physical Exam  pleasant stoic ambulatory white male in no acute distress,no longer  pseudowheeze    06/30/2013         242  Wt Readings from Last 3 Encounters:  05/19/13 245 lb (111.131 kg)  01/02/09 233 lb (105.688 kg)  09/23/07 231 lb (104.781 kg)       HEENT mild turbinate edema. Oropharynx no thrush or excess pnd or cobblestoning. No JVD or cervical adenopathy. Mild  accessory muscle hypertrophy. Trachea midline, nl thryroid. Chest was hyperinflated by percussion with diminished breath sounds and moderate increased exp time with a few expiratory and expiratory rhonchi but no cough reproduced on expiratory or expiratory maneuvers. Hoover sign positive at mid inspiration. Regular rate and rhythm without murmur gallop or rub or increase P2. Abd: no hsm, nl excursion. Ext warm without C,C or E.     CXR 05/19/13 Stable chest with findings of chronic obstructive pulmonary disease.  No acute cardiopulmonary  process.        Assessment & Plan:

## 2013-06-30 NOTE — Progress Notes (Signed)
PFT done today. 

## 2013-06-30 NOTE — Patient Instructions (Addendum)
Stay on Advair 250 but take it once daily to see what effect this has   Ok to stop the reflux medication to see what effects it has    If you are satisfied with your treatment plan let your doctor know and he/she can either refill your medications or you can return here when your prescription runs out.     If in any way you are not 100% satisfied,  please tell us.  If 100% better, tell your friends!

## 2013-06-30 NOTE — Assessment & Plan Note (Addendum)
-   rec trial off spiriva 05/19/2013  - PFTs 06/30/2013  FEV1 2.17 ( 73%) ratio 56 and no change p saba p advair in am and DLCO 53% corrects to 55   Better though muscle cramps may be due to laba and really may not need aggressive rx anyway given that this is moderate dz and he is quite sedentary so rec try just one puff of advair daily and if still cramping consider change to turdorza next ov as did not really do well on spriva previously and turdorza, though a LAMA, is quite a bit different in terms of side effects reported     Each maintenance medication was reviewed in detail including most importantly the difference between maintenance and as needed and under what circumstances the prns are to be used.  Please see instructions for details which were reviewed in writing and the patient given a copy.

## 2013-07-06 DIAGNOSIS — H353290 Exudative age-related macular degeneration, unspecified eye, stage unspecified: Secondary | ICD-10-CM | POA: Diagnosis not present

## 2013-07-12 DIAGNOSIS — I10 Essential (primary) hypertension: Secondary | ICD-10-CM | POA: Diagnosis not present

## 2013-07-12 DIAGNOSIS — R279 Unspecified lack of coordination: Secondary | ICD-10-CM | POA: Diagnosis not present

## 2013-07-12 DIAGNOSIS — R3589 Other polyuria: Secondary | ICD-10-CM | POA: Diagnosis not present

## 2013-07-12 DIAGNOSIS — R358 Other polyuria: Secondary | ICD-10-CM | POA: Diagnosis not present

## 2013-07-12 DIAGNOSIS — R609 Edema, unspecified: Secondary | ICD-10-CM | POA: Diagnosis not present

## 2013-07-12 DIAGNOSIS — R252 Cramp and spasm: Secondary | ICD-10-CM | POA: Diagnosis not present

## 2013-07-12 DIAGNOSIS — IMO0001 Reserved for inherently not codable concepts without codable children: Secondary | ICD-10-CM | POA: Diagnosis not present

## 2013-07-17 DIAGNOSIS — I509 Heart failure, unspecified: Secondary | ICD-10-CM | POA: Diagnosis not present

## 2013-07-17 DIAGNOSIS — E119 Type 2 diabetes mellitus without complications: Secondary | ICD-10-CM | POA: Diagnosis not present

## 2013-07-17 DIAGNOSIS — J441 Chronic obstructive pulmonary disease with (acute) exacerbation: Secondary | ICD-10-CM | POA: Diagnosis not present

## 2013-07-17 DIAGNOSIS — I10 Essential (primary) hypertension: Secondary | ICD-10-CM | POA: Diagnosis not present

## 2013-07-17 DIAGNOSIS — I739 Peripheral vascular disease, unspecified: Secondary | ICD-10-CM | POA: Diagnosis not present

## 2013-07-17 DIAGNOSIS — H548 Legal blindness, as defined in USA: Secondary | ICD-10-CM | POA: Diagnosis not present

## 2013-07-25 DIAGNOSIS — E1159 Type 2 diabetes mellitus with other circulatory complications: Secondary | ICD-10-CM | POA: Diagnosis not present

## 2013-07-25 DIAGNOSIS — B351 Tinea unguium: Secondary | ICD-10-CM | POA: Diagnosis not present

## 2013-07-25 DIAGNOSIS — I70209 Unspecified atherosclerosis of native arteries of extremities, unspecified extremity: Secondary | ICD-10-CM | POA: Diagnosis not present

## 2013-08-01 DIAGNOSIS — L570 Actinic keratosis: Secondary | ICD-10-CM | POA: Diagnosis not present

## 2013-08-01 DIAGNOSIS — L723 Sebaceous cyst: Secondary | ICD-10-CM | POA: Diagnosis not present

## 2013-08-03 DIAGNOSIS — R42 Dizziness and giddiness: Secondary | ICD-10-CM | POA: Diagnosis not present

## 2013-08-03 DIAGNOSIS — H353290 Exudative age-related macular degeneration, unspecified eye, stage unspecified: Secondary | ICD-10-CM | POA: Diagnosis not present

## 2013-08-21 DIAGNOSIS — H6120 Impacted cerumen, unspecified ear: Secondary | ICD-10-CM | POA: Diagnosis not present

## 2013-08-27 LAB — PULMONARY FUNCTION TEST
DL/VA % pred: 55 %
DL/VA: 2.61 ml/min/mmHg/L
DLCO unc % pred: 53 %
DLCO unc: 18.57 ml/min/mmHg
FEF 25-75 Post: 1.1 L/sec
FEF 25-75 Pre: 1.03 L/sec
FEF2575-%Change-Post: 6 %
FEF2575-%Pred-Post: 55 %
FEF2575-%Pred-Pre: 52 %
FEV1-%Change-Post: 4 %
FEV1-%Pred-Post: 76 %
FEV1-%Pred-Pre: 73 %
FEV1-Post: 2.27 L
FEV1-Pre: 2.17 L
FEV1FVC-%Change-Post: 0 %
FEV1FVC-%Pred-Pre: 79 %
FEV6-%Change-Post: 2 %
FEV6-%Pred-Post: 96 %
FEV6-%Pred-Pre: 94 %
FEV6-Post: 3.8 L
FEV6-Pre: 3.72 L
FEV6FVC-%Change-Post: -2 %
FEV6FVC-%Pred-Post: 101 %
FEV6FVC-%Pred-Pre: 104 %
FVC-%Change-Post: 4 %
FVC-%Pred-Post: 95 %
FVC-%Pred-Pre: 91 %
FVC-Post: 4 L
FVC-Pre: 3.85 L
Post FEV1/FVC ratio: 57 %
Post FEV6/FVC ratio: 95 %
Pre FEV1/FVC ratio: 56 %
Pre FEV6/FVC Ratio: 97 %
RV % pred: 176 %
RV: 5.01 L
TLC % pred: 124 %
TLC: 9.29 L

## 2013-09-14 DIAGNOSIS — H353290 Exudative age-related macular degeneration, unspecified eye, stage unspecified: Secondary | ICD-10-CM | POA: Diagnosis not present

## 2013-09-15 DIAGNOSIS — I509 Heart failure, unspecified: Secondary | ICD-10-CM | POA: Diagnosis not present

## 2013-09-15 DIAGNOSIS — G909 Disorder of the autonomic nervous system, unspecified: Secondary | ICD-10-CM | POA: Diagnosis not present

## 2013-09-15 DIAGNOSIS — I10 Essential (primary) hypertension: Secondary | ICD-10-CM | POA: Diagnosis not present

## 2013-09-15 DIAGNOSIS — J441 Chronic obstructive pulmonary disease with (acute) exacerbation: Secondary | ICD-10-CM | POA: Diagnosis not present

## 2013-09-15 DIAGNOSIS — Z780 Asymptomatic menopausal state: Secondary | ICD-10-CM | POA: Diagnosis not present

## 2013-09-15 DIAGNOSIS — N189 Chronic kidney disease, unspecified: Secondary | ICD-10-CM | POA: Diagnosis not present

## 2013-09-15 DIAGNOSIS — Z79899 Other long term (current) drug therapy: Secondary | ICD-10-CM | POA: Diagnosis not present

## 2013-09-15 DIAGNOSIS — E559 Vitamin D deficiency, unspecified: Secondary | ICD-10-CM | POA: Diagnosis not present

## 2013-09-15 DIAGNOSIS — H548 Legal blindness, as defined in USA: Secondary | ICD-10-CM | POA: Diagnosis not present

## 2013-09-15 DIAGNOSIS — I739 Peripheral vascular disease, unspecified: Secondary | ICD-10-CM | POA: Diagnosis not present

## 2013-09-15 DIAGNOSIS — D539 Nutritional anemia, unspecified: Secondary | ICD-10-CM | POA: Diagnosis not present

## 2013-09-15 DIAGNOSIS — E1149 Type 2 diabetes mellitus with other diabetic neurological complication: Secondary | ICD-10-CM | POA: Diagnosis not present

## 2013-09-15 DIAGNOSIS — M899 Disorder of bone, unspecified: Secondary | ICD-10-CM | POA: Diagnosis not present

## 2013-09-15 DIAGNOSIS — E119 Type 2 diabetes mellitus without complications: Secondary | ICD-10-CM | POA: Diagnosis not present

## 2013-09-15 DIAGNOSIS — M949 Disorder of cartilage, unspecified: Secondary | ICD-10-CM | POA: Diagnosis not present

## 2013-09-18 DIAGNOSIS — I10 Essential (primary) hypertension: Secondary | ICD-10-CM | POA: Diagnosis not present

## 2013-09-18 DIAGNOSIS — J441 Chronic obstructive pulmonary disease with (acute) exacerbation: Secondary | ICD-10-CM | POA: Diagnosis not present

## 2013-09-18 DIAGNOSIS — E119 Type 2 diabetes mellitus without complications: Secondary | ICD-10-CM | POA: Diagnosis not present

## 2013-09-18 DIAGNOSIS — I739 Peripheral vascular disease, unspecified: Secondary | ICD-10-CM | POA: Diagnosis not present

## 2013-09-18 DIAGNOSIS — H548 Legal blindness, as defined in USA: Secondary | ICD-10-CM | POA: Diagnosis not present

## 2013-09-18 DIAGNOSIS — I509 Heart failure, unspecified: Secondary | ICD-10-CM | POA: Diagnosis not present

## 2013-09-21 DIAGNOSIS — R195 Other fecal abnormalities: Secondary | ICD-10-CM | POA: Diagnosis not present

## 2013-09-21 DIAGNOSIS — D500 Iron deficiency anemia secondary to blood loss (chronic): Secondary | ICD-10-CM | POA: Diagnosis not present

## 2013-09-25 DIAGNOSIS — I739 Peripheral vascular disease, unspecified: Secondary | ICD-10-CM | POA: Diagnosis not present

## 2013-09-25 DIAGNOSIS — H548 Legal blindness, as defined in USA: Secondary | ICD-10-CM | POA: Diagnosis not present

## 2013-09-25 DIAGNOSIS — I10 Essential (primary) hypertension: Secondary | ICD-10-CM | POA: Diagnosis not present

## 2013-09-25 DIAGNOSIS — J441 Chronic obstructive pulmonary disease with (acute) exacerbation: Secondary | ICD-10-CM | POA: Diagnosis not present

## 2013-09-25 DIAGNOSIS — E119 Type 2 diabetes mellitus without complications: Secondary | ICD-10-CM | POA: Diagnosis not present

## 2013-09-25 DIAGNOSIS — I509 Heart failure, unspecified: Secondary | ICD-10-CM | POA: Diagnosis not present

## 2013-09-26 DIAGNOSIS — D649 Anemia, unspecified: Secondary | ICD-10-CM | POA: Diagnosis not present

## 2013-09-26 DIAGNOSIS — D509 Iron deficiency anemia, unspecified: Secondary | ICD-10-CM | POA: Diagnosis not present

## 2013-09-26 DIAGNOSIS — R195 Other fecal abnormalities: Secondary | ICD-10-CM | POA: Diagnosis not present

## 2013-09-28 DIAGNOSIS — L570 Actinic keratosis: Secondary | ICD-10-CM | POA: Diagnosis not present

## 2013-09-28 DIAGNOSIS — L723 Sebaceous cyst: Secondary | ICD-10-CM | POA: Diagnosis not present

## 2013-10-03 DIAGNOSIS — I10 Essential (primary) hypertension: Secondary | ICD-10-CM | POA: Diagnosis not present

## 2013-10-03 DIAGNOSIS — J441 Chronic obstructive pulmonary disease with (acute) exacerbation: Secondary | ICD-10-CM | POA: Diagnosis not present

## 2013-10-03 DIAGNOSIS — H548 Legal blindness, as defined in USA: Secondary | ICD-10-CM | POA: Diagnosis not present

## 2013-10-03 DIAGNOSIS — E119 Type 2 diabetes mellitus without complications: Secondary | ICD-10-CM | POA: Diagnosis not present

## 2013-10-03 DIAGNOSIS — I509 Heart failure, unspecified: Secondary | ICD-10-CM | POA: Diagnosis not present

## 2013-10-03 DIAGNOSIS — I739 Peripheral vascular disease, unspecified: Secondary | ICD-10-CM | POA: Diagnosis not present

## 2013-10-09 DIAGNOSIS — H548 Legal blindness, as defined in USA: Secondary | ICD-10-CM | POA: Diagnosis not present

## 2013-10-09 DIAGNOSIS — E119 Type 2 diabetes mellitus without complications: Secondary | ICD-10-CM | POA: Diagnosis not present

## 2013-10-09 DIAGNOSIS — I509 Heart failure, unspecified: Secondary | ICD-10-CM | POA: Diagnosis not present

## 2013-10-09 DIAGNOSIS — I739 Peripheral vascular disease, unspecified: Secondary | ICD-10-CM | POA: Diagnosis not present

## 2013-10-09 DIAGNOSIS — I10 Essential (primary) hypertension: Secondary | ICD-10-CM | POA: Diagnosis not present

## 2013-10-09 DIAGNOSIS — J441 Chronic obstructive pulmonary disease with (acute) exacerbation: Secondary | ICD-10-CM | POA: Diagnosis not present

## 2013-10-12 DIAGNOSIS — H353290 Exudative age-related macular degeneration, unspecified eye, stage unspecified: Secondary | ICD-10-CM | POA: Diagnosis not present

## 2013-10-16 DIAGNOSIS — I10 Essential (primary) hypertension: Secondary | ICD-10-CM | POA: Diagnosis not present

## 2013-10-16 DIAGNOSIS — H548 Legal blindness, as defined in USA: Secondary | ICD-10-CM | POA: Diagnosis not present

## 2013-10-16 DIAGNOSIS — J441 Chronic obstructive pulmonary disease with (acute) exacerbation: Secondary | ICD-10-CM | POA: Diagnosis not present

## 2013-10-16 DIAGNOSIS — I509 Heart failure, unspecified: Secondary | ICD-10-CM | POA: Diagnosis not present

## 2013-10-16 DIAGNOSIS — E119 Type 2 diabetes mellitus without complications: Secondary | ICD-10-CM | POA: Diagnosis not present

## 2013-10-16 DIAGNOSIS — I739 Peripheral vascular disease, unspecified: Secondary | ICD-10-CM | POA: Diagnosis not present

## 2013-10-20 DIAGNOSIS — E119 Type 2 diabetes mellitus without complications: Secondary | ICD-10-CM | POA: Diagnosis not present

## 2013-10-20 DIAGNOSIS — E785 Hyperlipidemia, unspecified: Secondary | ICD-10-CM | POA: Diagnosis not present

## 2013-10-20 DIAGNOSIS — Z79899 Other long term (current) drug therapy: Secondary | ICD-10-CM | POA: Diagnosis not present

## 2013-10-20 DIAGNOSIS — I6529 Occlusion and stenosis of unspecified carotid artery: Secondary | ICD-10-CM | POA: Diagnosis not present

## 2013-10-20 DIAGNOSIS — Z950 Presence of cardiac pacemaker: Secondary | ICD-10-CM | POA: Diagnosis not present

## 2013-10-20 DIAGNOSIS — I739 Peripheral vascular disease, unspecified: Secondary | ICD-10-CM | POA: Diagnosis not present

## 2013-10-23 DIAGNOSIS — I739 Peripheral vascular disease, unspecified: Secondary | ICD-10-CM | POA: Diagnosis not present

## 2013-10-23 DIAGNOSIS — J441 Chronic obstructive pulmonary disease with (acute) exacerbation: Secondary | ICD-10-CM | POA: Diagnosis not present

## 2013-10-23 DIAGNOSIS — I509 Heart failure, unspecified: Secondary | ICD-10-CM | POA: Diagnosis not present

## 2013-10-23 DIAGNOSIS — H548 Legal blindness, as defined in USA: Secondary | ICD-10-CM | POA: Diagnosis not present

## 2013-10-23 DIAGNOSIS — E119 Type 2 diabetes mellitus without complications: Secondary | ICD-10-CM | POA: Diagnosis not present

## 2013-10-23 DIAGNOSIS — I10 Essential (primary) hypertension: Secondary | ICD-10-CM | POA: Diagnosis not present

## 2013-10-24 DIAGNOSIS — L570 Actinic keratosis: Secondary | ICD-10-CM | POA: Diagnosis not present

## 2013-10-30 DIAGNOSIS — E119 Type 2 diabetes mellitus without complications: Secondary | ICD-10-CM | POA: Diagnosis not present

## 2013-10-30 DIAGNOSIS — H0012 Chalazion right lower eyelid: Secondary | ICD-10-CM | POA: Diagnosis not present

## 2013-10-30 DIAGNOSIS — J441 Chronic obstructive pulmonary disease with (acute) exacerbation: Secondary | ICD-10-CM | POA: Diagnosis not present

## 2013-10-30 DIAGNOSIS — H548 Legal blindness, as defined in USA: Secondary | ICD-10-CM | POA: Diagnosis not present

## 2013-10-30 DIAGNOSIS — I509 Heart failure, unspecified: Secondary | ICD-10-CM | POA: Diagnosis not present

## 2013-10-30 DIAGNOSIS — I739 Peripheral vascular disease, unspecified: Secondary | ICD-10-CM | POA: Diagnosis not present

## 2013-10-30 DIAGNOSIS — I10 Essential (primary) hypertension: Secondary | ICD-10-CM | POA: Diagnosis not present

## 2013-11-06 DIAGNOSIS — J441 Chronic obstructive pulmonary disease with (acute) exacerbation: Secondary | ICD-10-CM | POA: Diagnosis not present

## 2013-11-06 DIAGNOSIS — E119 Type 2 diabetes mellitus without complications: Secondary | ICD-10-CM | POA: Diagnosis not present

## 2013-11-06 DIAGNOSIS — I10 Essential (primary) hypertension: Secondary | ICD-10-CM | POA: Diagnosis not present

## 2013-11-06 DIAGNOSIS — I509 Heart failure, unspecified: Secondary | ICD-10-CM | POA: Diagnosis not present

## 2013-11-06 DIAGNOSIS — H548 Legal blindness, as defined in USA: Secondary | ICD-10-CM | POA: Diagnosis not present

## 2013-11-06 DIAGNOSIS — I739 Peripheral vascular disease, unspecified: Secondary | ICD-10-CM | POA: Diagnosis not present

## 2013-11-07 DIAGNOSIS — I498 Other specified cardiac arrhythmias: Secondary | ICD-10-CM | POA: Diagnosis not present

## 2013-11-09 DIAGNOSIS — I70209 Unspecified atherosclerosis of native arteries of extremities, unspecified extremity: Secondary | ICD-10-CM | POA: Diagnosis not present

## 2013-11-09 DIAGNOSIS — E1159 Type 2 diabetes mellitus with other circulatory complications: Secondary | ICD-10-CM | POA: Diagnosis not present

## 2013-11-09 DIAGNOSIS — B351 Tinea unguium: Secondary | ICD-10-CM | POA: Diagnosis not present

## 2013-11-13 DIAGNOSIS — E119 Type 2 diabetes mellitus without complications: Secondary | ICD-10-CM | POA: Diagnosis not present

## 2013-11-13 DIAGNOSIS — I10 Essential (primary) hypertension: Secondary | ICD-10-CM | POA: Diagnosis not present

## 2013-11-13 DIAGNOSIS — I739 Peripheral vascular disease, unspecified: Secondary | ICD-10-CM | POA: Diagnosis not present

## 2013-11-13 DIAGNOSIS — J441 Chronic obstructive pulmonary disease with (acute) exacerbation: Secondary | ICD-10-CM | POA: Diagnosis not present

## 2013-11-13 DIAGNOSIS — H548 Legal blindness, as defined in USA: Secondary | ICD-10-CM | POA: Diagnosis not present

## 2013-11-13 DIAGNOSIS — I509 Heart failure, unspecified: Secondary | ICD-10-CM | POA: Diagnosis not present

## 2013-11-14 DIAGNOSIS — E119 Type 2 diabetes mellitus without complications: Secondary | ICD-10-CM | POA: Diagnosis not present

## 2013-11-14 DIAGNOSIS — I739 Peripheral vascular disease, unspecified: Secondary | ICD-10-CM | POA: Diagnosis not present

## 2013-11-14 DIAGNOSIS — H548 Legal blindness, as defined in USA: Secondary | ICD-10-CM | POA: Diagnosis not present

## 2013-11-14 DIAGNOSIS — D509 Iron deficiency anemia, unspecified: Secondary | ICD-10-CM | POA: Diagnosis not present

## 2013-11-14 DIAGNOSIS — J441 Chronic obstructive pulmonary disease with (acute) exacerbation: Secondary | ICD-10-CM | POA: Diagnosis not present

## 2013-11-14 DIAGNOSIS — I509 Heart failure, unspecified: Secondary | ICD-10-CM | POA: Diagnosis not present

## 2013-11-14 DIAGNOSIS — I10 Essential (primary) hypertension: Secondary | ICD-10-CM | POA: Diagnosis not present

## 2013-11-14 DIAGNOSIS — C61 Malignant neoplasm of prostate: Secondary | ICD-10-CM | POA: Diagnosis not present

## 2013-11-16 DIAGNOSIS — R351 Nocturia: Secondary | ICD-10-CM | POA: Diagnosis not present

## 2013-11-16 DIAGNOSIS — C61 Malignant neoplasm of prostate: Secondary | ICD-10-CM | POA: Diagnosis not present

## 2013-11-16 DIAGNOSIS — D509 Iron deficiency anemia, unspecified: Secondary | ICD-10-CM | POA: Diagnosis not present

## 2013-11-20 DIAGNOSIS — I739 Peripheral vascular disease, unspecified: Secondary | ICD-10-CM | POA: Diagnosis not present

## 2013-11-20 DIAGNOSIS — E119 Type 2 diabetes mellitus without complications: Secondary | ICD-10-CM | POA: Diagnosis not present

## 2013-11-20 DIAGNOSIS — I509 Heart failure, unspecified: Secondary | ICD-10-CM | POA: Diagnosis not present

## 2013-11-20 DIAGNOSIS — J441 Chronic obstructive pulmonary disease with (acute) exacerbation: Secondary | ICD-10-CM | POA: Diagnosis not present

## 2013-11-20 DIAGNOSIS — I10 Essential (primary) hypertension: Secondary | ICD-10-CM | POA: Diagnosis not present

## 2013-11-20 DIAGNOSIS — H548 Legal blindness, as defined in USA: Secondary | ICD-10-CM | POA: Diagnosis not present

## 2013-11-21 DIAGNOSIS — E119 Type 2 diabetes mellitus without complications: Secondary | ICD-10-CM | POA: Diagnosis not present

## 2013-11-23 DIAGNOSIS — H3532 Exudative age-related macular degeneration: Secondary | ICD-10-CM | POA: Diagnosis not present

## 2013-11-27 DIAGNOSIS — J441 Chronic obstructive pulmonary disease with (acute) exacerbation: Secondary | ICD-10-CM | POA: Diagnosis not present

## 2013-11-27 DIAGNOSIS — E119 Type 2 diabetes mellitus without complications: Secondary | ICD-10-CM | POA: Diagnosis not present

## 2013-11-27 DIAGNOSIS — I739 Peripheral vascular disease, unspecified: Secondary | ICD-10-CM | POA: Diagnosis not present

## 2013-11-27 DIAGNOSIS — I509 Heart failure, unspecified: Secondary | ICD-10-CM | POA: Diagnosis not present

## 2013-11-27 DIAGNOSIS — I10 Essential (primary) hypertension: Secondary | ICD-10-CM | POA: Diagnosis not present

## 2013-11-27 DIAGNOSIS — H548 Legal blindness, as defined in USA: Secondary | ICD-10-CM | POA: Diagnosis not present

## 2013-11-28 DIAGNOSIS — Z79899 Other long term (current) drug therapy: Secondary | ICD-10-CM | POA: Diagnosis not present

## 2013-11-28 DIAGNOSIS — E785 Hyperlipidemia, unspecified: Secondary | ICD-10-CM | POA: Diagnosis not present

## 2013-11-28 DIAGNOSIS — I739 Peripheral vascular disease, unspecified: Secondary | ICD-10-CM | POA: Diagnosis not present

## 2013-11-28 DIAGNOSIS — I6529 Occlusion and stenosis of unspecified carotid artery: Secondary | ICD-10-CM | POA: Diagnosis not present

## 2013-11-28 DIAGNOSIS — E119 Type 2 diabetes mellitus without complications: Secondary | ICD-10-CM | POA: Diagnosis not present

## 2013-11-28 DIAGNOSIS — Z950 Presence of cardiac pacemaker: Secondary | ICD-10-CM | POA: Diagnosis not present

## 2013-12-04 DIAGNOSIS — I739 Peripheral vascular disease, unspecified: Secondary | ICD-10-CM | POA: Diagnosis not present

## 2013-12-04 DIAGNOSIS — E119 Type 2 diabetes mellitus without complications: Secondary | ICD-10-CM | POA: Diagnosis not present

## 2013-12-04 DIAGNOSIS — I10 Essential (primary) hypertension: Secondary | ICD-10-CM | POA: Diagnosis not present

## 2013-12-04 DIAGNOSIS — J441 Chronic obstructive pulmonary disease with (acute) exacerbation: Secondary | ICD-10-CM | POA: Diagnosis not present

## 2013-12-04 DIAGNOSIS — H548 Legal blindness, as defined in USA: Secondary | ICD-10-CM | POA: Diagnosis not present

## 2013-12-04 DIAGNOSIS — I509 Heart failure, unspecified: Secondary | ICD-10-CM | POA: Diagnosis not present

## 2013-12-05 DIAGNOSIS — N189 Chronic kidney disease, unspecified: Secondary | ICD-10-CM | POA: Diagnosis not present

## 2013-12-05 DIAGNOSIS — E559 Vitamin D deficiency, unspecified: Secondary | ICD-10-CM | POA: Diagnosis not present

## 2013-12-05 DIAGNOSIS — E1140 Type 2 diabetes mellitus with diabetic neuropathy, unspecified: Secondary | ICD-10-CM | POA: Diagnosis not present

## 2013-12-11 DIAGNOSIS — I10 Essential (primary) hypertension: Secondary | ICD-10-CM | POA: Diagnosis not present

## 2013-12-11 DIAGNOSIS — I739 Peripheral vascular disease, unspecified: Secondary | ICD-10-CM | POA: Diagnosis not present

## 2013-12-11 DIAGNOSIS — E119 Type 2 diabetes mellitus without complications: Secondary | ICD-10-CM | POA: Diagnosis not present

## 2013-12-11 DIAGNOSIS — J441 Chronic obstructive pulmonary disease with (acute) exacerbation: Secondary | ICD-10-CM | POA: Diagnosis not present

## 2013-12-11 DIAGNOSIS — H548 Legal blindness, as defined in USA: Secondary | ICD-10-CM | POA: Diagnosis not present

## 2013-12-11 DIAGNOSIS — I509 Heart failure, unspecified: Secondary | ICD-10-CM | POA: Diagnosis not present

## 2013-12-12 DIAGNOSIS — I313 Pericardial effusion (noninflammatory): Secondary | ICD-10-CM | POA: Diagnosis not present

## 2013-12-12 DIAGNOSIS — I429 Cardiomyopathy, unspecified: Secondary | ICD-10-CM | POA: Diagnosis not present

## 2013-12-12 DIAGNOSIS — Z950 Presence of cardiac pacemaker: Secondary | ICD-10-CM | POA: Diagnosis not present

## 2013-12-18 DIAGNOSIS — I739 Peripheral vascular disease, unspecified: Secondary | ICD-10-CM | POA: Diagnosis not present

## 2013-12-18 DIAGNOSIS — I509 Heart failure, unspecified: Secondary | ICD-10-CM | POA: Diagnosis not present

## 2013-12-18 DIAGNOSIS — J441 Chronic obstructive pulmonary disease with (acute) exacerbation: Secondary | ICD-10-CM | POA: Diagnosis not present

## 2013-12-18 DIAGNOSIS — H548 Legal blindness, as defined in USA: Secondary | ICD-10-CM | POA: Diagnosis not present

## 2013-12-18 DIAGNOSIS — I10 Essential (primary) hypertension: Secondary | ICD-10-CM | POA: Diagnosis not present

## 2013-12-18 DIAGNOSIS — E119 Type 2 diabetes mellitus without complications: Secondary | ICD-10-CM | POA: Diagnosis not present

## 2013-12-25 DIAGNOSIS — I739 Peripheral vascular disease, unspecified: Secondary | ICD-10-CM | POA: Diagnosis not present

## 2013-12-25 DIAGNOSIS — I509 Heart failure, unspecified: Secondary | ICD-10-CM | POA: Diagnosis not present

## 2013-12-25 DIAGNOSIS — E119 Type 2 diabetes mellitus without complications: Secondary | ICD-10-CM | POA: Diagnosis not present

## 2013-12-25 DIAGNOSIS — I10 Essential (primary) hypertension: Secondary | ICD-10-CM | POA: Diagnosis not present

## 2013-12-25 DIAGNOSIS — H548 Legal blindness, as defined in USA: Secondary | ICD-10-CM | POA: Diagnosis not present

## 2013-12-25 DIAGNOSIS — J441 Chronic obstructive pulmonary disease with (acute) exacerbation: Secondary | ICD-10-CM | POA: Diagnosis not present

## 2013-12-26 DIAGNOSIS — L821 Other seborrheic keratosis: Secondary | ICD-10-CM | POA: Diagnosis not present

## 2013-12-26 DIAGNOSIS — L570 Actinic keratosis: Secondary | ICD-10-CM | POA: Diagnosis not present

## 2013-12-28 DIAGNOSIS — D509 Iron deficiency anemia, unspecified: Secondary | ICD-10-CM | POA: Diagnosis not present

## 2013-12-28 DIAGNOSIS — R195 Other fecal abnormalities: Secondary | ICD-10-CM | POA: Diagnosis not present

## 2014-01-01 DIAGNOSIS — I509 Heart failure, unspecified: Secondary | ICD-10-CM | POA: Diagnosis not present

## 2014-01-01 DIAGNOSIS — J441 Chronic obstructive pulmonary disease with (acute) exacerbation: Secondary | ICD-10-CM | POA: Diagnosis not present

## 2014-01-01 DIAGNOSIS — I10 Essential (primary) hypertension: Secondary | ICD-10-CM | POA: Diagnosis not present

## 2014-01-01 DIAGNOSIS — I739 Peripheral vascular disease, unspecified: Secondary | ICD-10-CM | POA: Diagnosis not present

## 2014-01-01 DIAGNOSIS — E119 Type 2 diabetes mellitus without complications: Secondary | ICD-10-CM | POA: Diagnosis not present

## 2014-01-01 DIAGNOSIS — H548 Legal blindness, as defined in USA: Secondary | ICD-10-CM | POA: Diagnosis not present

## 2014-01-02 DIAGNOSIS — D500 Iron deficiency anemia secondary to blood loss (chronic): Secondary | ICD-10-CM | POA: Diagnosis not present

## 2014-01-02 DIAGNOSIS — I70209 Unspecified atherosclerosis of native arteries of extremities, unspecified extremity: Secondary | ICD-10-CM | POA: Diagnosis not present

## 2014-01-02 DIAGNOSIS — E785 Hyperlipidemia, unspecified: Secondary | ICD-10-CM | POA: Diagnosis not present

## 2014-01-02 DIAGNOSIS — I10 Essential (primary) hypertension: Secondary | ICD-10-CM | POA: Diagnosis not present

## 2014-01-02 DIAGNOSIS — R195 Other fecal abnormalities: Secondary | ICD-10-CM | POA: Diagnosis not present

## 2014-01-02 DIAGNOSIS — E119 Type 2 diabetes mellitus without complications: Secondary | ICD-10-CM | POA: Diagnosis not present

## 2014-01-08 DIAGNOSIS — I10 Essential (primary) hypertension: Secondary | ICD-10-CM | POA: Diagnosis not present

## 2014-01-08 DIAGNOSIS — I509 Heart failure, unspecified: Secondary | ICD-10-CM | POA: Diagnosis not present

## 2014-01-08 DIAGNOSIS — I739 Peripheral vascular disease, unspecified: Secondary | ICD-10-CM | POA: Diagnosis not present

## 2014-01-08 DIAGNOSIS — E119 Type 2 diabetes mellitus without complications: Secondary | ICD-10-CM | POA: Diagnosis not present

## 2014-01-08 DIAGNOSIS — H548 Legal blindness, as defined in USA: Secondary | ICD-10-CM | POA: Diagnosis not present

## 2014-01-08 DIAGNOSIS — J441 Chronic obstructive pulmonary disease with (acute) exacerbation: Secondary | ICD-10-CM | POA: Diagnosis not present

## 2014-01-13 DIAGNOSIS — J449 Chronic obstructive pulmonary disease, unspecified: Secondary | ICD-10-CM | POA: Diagnosis not present

## 2014-01-13 DIAGNOSIS — H548 Legal blindness, as defined in USA: Secondary | ICD-10-CM | POA: Diagnosis not present

## 2014-01-13 DIAGNOSIS — I10 Essential (primary) hypertension: Secondary | ICD-10-CM | POA: Diagnosis not present

## 2014-01-13 DIAGNOSIS — I739 Peripheral vascular disease, unspecified: Secondary | ICD-10-CM | POA: Diagnosis not present

## 2014-01-13 DIAGNOSIS — I509 Heart failure, unspecified: Secondary | ICD-10-CM | POA: Diagnosis not present

## 2014-01-13 DIAGNOSIS — E119 Type 2 diabetes mellitus without complications: Secondary | ICD-10-CM | POA: Diagnosis not present

## 2014-01-15 DIAGNOSIS — I739 Peripheral vascular disease, unspecified: Secondary | ICD-10-CM | POA: Diagnosis not present

## 2014-01-15 DIAGNOSIS — J449 Chronic obstructive pulmonary disease, unspecified: Secondary | ICD-10-CM | POA: Diagnosis not present

## 2014-01-15 DIAGNOSIS — I509 Heart failure, unspecified: Secondary | ICD-10-CM | POA: Diagnosis not present

## 2014-01-15 DIAGNOSIS — H548 Legal blindness, as defined in USA: Secondary | ICD-10-CM | POA: Diagnosis not present

## 2014-01-15 DIAGNOSIS — E119 Type 2 diabetes mellitus without complications: Secondary | ICD-10-CM | POA: Diagnosis not present

## 2014-01-15 DIAGNOSIS — I10 Essential (primary) hypertension: Secondary | ICD-10-CM | POA: Diagnosis not present

## 2014-01-20 DIAGNOSIS — R531 Weakness: Secondary | ICD-10-CM | POA: Diagnosis not present

## 2014-01-20 DIAGNOSIS — Z87891 Personal history of nicotine dependence: Secondary | ICD-10-CM | POA: Diagnosis not present

## 2014-01-20 DIAGNOSIS — R55 Syncope and collapse: Secondary | ICD-10-CM | POA: Diagnosis not present

## 2014-01-20 DIAGNOSIS — I6782 Cerebral ischemia: Secondary | ICD-10-CM | POA: Diagnosis not present

## 2014-01-20 DIAGNOSIS — H8110 Benign paroxysmal vertigo, unspecified ear: Secondary | ICD-10-CM | POA: Diagnosis not present

## 2014-01-20 DIAGNOSIS — I10 Essential (primary) hypertension: Secondary | ICD-10-CM | POA: Diagnosis not present

## 2014-01-20 DIAGNOSIS — E119 Type 2 diabetes mellitus without complications: Secondary | ICD-10-CM | POA: Diagnosis not present

## 2014-01-20 DIAGNOSIS — R42 Dizziness and giddiness: Secondary | ICD-10-CM | POA: Diagnosis not present

## 2014-01-20 DIAGNOSIS — R404 Transient alteration of awareness: Secondary | ICD-10-CM | POA: Diagnosis not present

## 2014-01-20 DIAGNOSIS — J449 Chronic obstructive pulmonary disease, unspecified: Secondary | ICD-10-CM | POA: Diagnosis not present

## 2014-01-20 DIAGNOSIS — E780 Pure hypercholesterolemia: Secondary | ICD-10-CM | POA: Diagnosis not present

## 2014-01-22 DIAGNOSIS — I509 Heart failure, unspecified: Secondary | ICD-10-CM | POA: Diagnosis not present

## 2014-01-22 DIAGNOSIS — J449 Chronic obstructive pulmonary disease, unspecified: Secondary | ICD-10-CM | POA: Diagnosis not present

## 2014-01-22 DIAGNOSIS — E119 Type 2 diabetes mellitus without complications: Secondary | ICD-10-CM | POA: Diagnosis not present

## 2014-01-22 DIAGNOSIS — H548 Legal blindness, as defined in USA: Secondary | ICD-10-CM | POA: Diagnosis not present

## 2014-01-22 DIAGNOSIS — I739 Peripheral vascular disease, unspecified: Secondary | ICD-10-CM | POA: Diagnosis not present

## 2014-01-22 DIAGNOSIS — I10 Essential (primary) hypertension: Secondary | ICD-10-CM | POA: Diagnosis not present

## 2014-01-24 DIAGNOSIS — I10 Essential (primary) hypertension: Secondary | ICD-10-CM | POA: Diagnosis not present

## 2014-01-24 DIAGNOSIS — H8110 Benign paroxysmal vertigo, unspecified ear: Secondary | ICD-10-CM | POA: Diagnosis not present

## 2014-01-25 DIAGNOSIS — H3532 Exudative age-related macular degeneration: Secondary | ICD-10-CM | POA: Diagnosis not present

## 2014-01-29 DIAGNOSIS — I739 Peripheral vascular disease, unspecified: Secondary | ICD-10-CM | POA: Diagnosis not present

## 2014-01-29 DIAGNOSIS — E119 Type 2 diabetes mellitus without complications: Secondary | ICD-10-CM | POA: Diagnosis not present

## 2014-01-29 DIAGNOSIS — I10 Essential (primary) hypertension: Secondary | ICD-10-CM | POA: Diagnosis not present

## 2014-01-29 DIAGNOSIS — I509 Heart failure, unspecified: Secondary | ICD-10-CM | POA: Diagnosis not present

## 2014-01-29 DIAGNOSIS — J449 Chronic obstructive pulmonary disease, unspecified: Secondary | ICD-10-CM | POA: Diagnosis not present

## 2014-01-29 DIAGNOSIS — H548 Legal blindness, as defined in USA: Secondary | ICD-10-CM | POA: Diagnosis not present

## 2014-01-30 DIAGNOSIS — Z950 Presence of cardiac pacemaker: Secondary | ICD-10-CM | POA: Diagnosis not present

## 2014-01-30 DIAGNOSIS — E785 Hyperlipidemia, unspecified: Secondary | ICD-10-CM | POA: Diagnosis not present

## 2014-01-30 DIAGNOSIS — I10 Essential (primary) hypertension: Secondary | ICD-10-CM | POA: Diagnosis not present

## 2014-01-30 DIAGNOSIS — I70209 Unspecified atherosclerosis of native arteries of extremities, unspecified extremity: Secondary | ICD-10-CM | POA: Diagnosis not present

## 2014-01-30 DIAGNOSIS — I739 Peripheral vascular disease, unspecified: Secondary | ICD-10-CM | POA: Diagnosis not present

## 2014-01-30 DIAGNOSIS — E1159 Type 2 diabetes mellitus with other circulatory complications: Secondary | ICD-10-CM | POA: Diagnosis not present

## 2014-02-05 DIAGNOSIS — E119 Type 2 diabetes mellitus without complications: Secondary | ICD-10-CM | POA: Diagnosis not present

## 2014-02-05 DIAGNOSIS — I509 Heart failure, unspecified: Secondary | ICD-10-CM | POA: Diagnosis not present

## 2014-02-05 DIAGNOSIS — J449 Chronic obstructive pulmonary disease, unspecified: Secondary | ICD-10-CM | POA: Diagnosis not present

## 2014-02-05 DIAGNOSIS — I10 Essential (primary) hypertension: Secondary | ICD-10-CM | POA: Diagnosis not present

## 2014-02-05 DIAGNOSIS — H548 Legal blindness, as defined in USA: Secondary | ICD-10-CM | POA: Diagnosis not present

## 2014-02-05 DIAGNOSIS — I739 Peripheral vascular disease, unspecified: Secondary | ICD-10-CM | POA: Diagnosis not present

## 2014-02-07 DIAGNOSIS — Z79899 Other long term (current) drug therapy: Secondary | ICD-10-CM | POA: Diagnosis not present

## 2014-02-07 DIAGNOSIS — R509 Fever, unspecified: Secondary | ICD-10-CM | POA: Diagnosis not present

## 2014-02-07 DIAGNOSIS — R358 Other polyuria: Secondary | ICD-10-CM | POA: Diagnosis not present

## 2014-02-07 DIAGNOSIS — D509 Iron deficiency anemia, unspecified: Secondary | ICD-10-CM | POA: Diagnosis not present

## 2014-02-07 DIAGNOSIS — R5381 Other malaise: Secondary | ICD-10-CM | POA: Diagnosis not present

## 2014-02-07 DIAGNOSIS — I951 Orthostatic hypotension: Secondary | ICD-10-CM | POA: Diagnosis not present

## 2014-02-08 DIAGNOSIS — R509 Fever, unspecified: Secondary | ICD-10-CM | POA: Diagnosis not present

## 2014-02-08 DIAGNOSIS — I498 Other specified cardiac arrhythmias: Secondary | ICD-10-CM | POA: Diagnosis not present

## 2014-02-12 DIAGNOSIS — E119 Type 2 diabetes mellitus without complications: Secondary | ICD-10-CM | POA: Diagnosis not present

## 2014-02-12 DIAGNOSIS — I509 Heart failure, unspecified: Secondary | ICD-10-CM | POA: Diagnosis not present

## 2014-02-12 DIAGNOSIS — J449 Chronic obstructive pulmonary disease, unspecified: Secondary | ICD-10-CM | POA: Diagnosis not present

## 2014-02-12 DIAGNOSIS — I739 Peripheral vascular disease, unspecified: Secondary | ICD-10-CM | POA: Diagnosis not present

## 2014-02-12 DIAGNOSIS — H548 Legal blindness, as defined in USA: Secondary | ICD-10-CM | POA: Diagnosis not present

## 2014-02-12 DIAGNOSIS — I10 Essential (primary) hypertension: Secondary | ICD-10-CM | POA: Diagnosis not present

## 2014-02-13 DIAGNOSIS — Z79899 Other long term (current) drug therapy: Secondary | ICD-10-CM | POA: Diagnosis not present

## 2014-02-19 DIAGNOSIS — I509 Heart failure, unspecified: Secondary | ICD-10-CM | POA: Diagnosis not present

## 2014-02-19 DIAGNOSIS — J449 Chronic obstructive pulmonary disease, unspecified: Secondary | ICD-10-CM | POA: Diagnosis not present

## 2014-02-19 DIAGNOSIS — I739 Peripheral vascular disease, unspecified: Secondary | ICD-10-CM | POA: Diagnosis not present

## 2014-02-19 DIAGNOSIS — I10 Essential (primary) hypertension: Secondary | ICD-10-CM | POA: Diagnosis not present

## 2014-02-19 DIAGNOSIS — E119 Type 2 diabetes mellitus without complications: Secondary | ICD-10-CM | POA: Diagnosis not present

## 2014-02-19 DIAGNOSIS — H548 Legal blindness, as defined in USA: Secondary | ICD-10-CM | POA: Diagnosis not present

## 2014-02-20 DIAGNOSIS — I70209 Unspecified atherosclerosis of native arteries of extremities, unspecified extremity: Secondary | ICD-10-CM | POA: Diagnosis not present

## 2014-02-20 DIAGNOSIS — I739 Peripheral vascular disease, unspecified: Secondary | ICD-10-CM | POA: Diagnosis not present

## 2014-02-20 DIAGNOSIS — B351 Tinea unguium: Secondary | ICD-10-CM | POA: Diagnosis not present

## 2014-02-20 DIAGNOSIS — E1159 Type 2 diabetes mellitus with other circulatory complications: Secondary | ICD-10-CM | POA: Diagnosis not present

## 2014-02-26 DIAGNOSIS — I509 Heart failure, unspecified: Secondary | ICD-10-CM | POA: Diagnosis not present

## 2014-02-26 DIAGNOSIS — J449 Chronic obstructive pulmonary disease, unspecified: Secondary | ICD-10-CM | POA: Diagnosis not present

## 2014-02-26 DIAGNOSIS — E119 Type 2 diabetes mellitus without complications: Secondary | ICD-10-CM | POA: Diagnosis not present

## 2014-02-26 DIAGNOSIS — H548 Legal blindness, as defined in USA: Secondary | ICD-10-CM | POA: Diagnosis not present

## 2014-02-26 DIAGNOSIS — I10 Essential (primary) hypertension: Secondary | ICD-10-CM | POA: Diagnosis not present

## 2014-02-26 DIAGNOSIS — I739 Peripheral vascular disease, unspecified: Secondary | ICD-10-CM | POA: Diagnosis not present

## 2014-02-28 DIAGNOSIS — C61 Malignant neoplasm of prostate: Secondary | ICD-10-CM | POA: Diagnosis not present

## 2014-02-28 DIAGNOSIS — D509 Iron deficiency anemia, unspecified: Secondary | ICD-10-CM | POA: Diagnosis not present

## 2014-03-05 ENCOUNTER — Other Ambulatory Visit: Payer: Self-pay | Admitting: Internal Medicine

## 2014-03-05 DIAGNOSIS — H548 Legal blindness, as defined in USA: Secondary | ICD-10-CM | POA: Diagnosis not present

## 2014-03-05 DIAGNOSIS — E119 Type 2 diabetes mellitus without complications: Secondary | ICD-10-CM | POA: Diagnosis not present

## 2014-03-05 DIAGNOSIS — J449 Chronic obstructive pulmonary disease, unspecified: Secondary | ICD-10-CM | POA: Diagnosis not present

## 2014-03-05 DIAGNOSIS — I739 Peripheral vascular disease, unspecified: Secondary | ICD-10-CM | POA: Diagnosis not present

## 2014-03-05 DIAGNOSIS — I509 Heart failure, unspecified: Secondary | ICD-10-CM | POA: Diagnosis not present

## 2014-03-05 DIAGNOSIS — I10 Essential (primary) hypertension: Secondary | ICD-10-CM | POA: Diagnosis not present

## 2014-03-07 DIAGNOSIS — C61 Malignant neoplasm of prostate: Secondary | ICD-10-CM | POA: Diagnosis not present

## 2014-03-07 DIAGNOSIS — D61818 Other pancytopenia: Secondary | ICD-10-CM | POA: Diagnosis not present

## 2014-03-08 DIAGNOSIS — H3532 Exudative age-related macular degeneration: Secondary | ICD-10-CM | POA: Diagnosis not present

## 2014-03-12 DIAGNOSIS — E119 Type 2 diabetes mellitus without complications: Secondary | ICD-10-CM | POA: Diagnosis not present

## 2014-03-12 DIAGNOSIS — I739 Peripheral vascular disease, unspecified: Secondary | ICD-10-CM | POA: Diagnosis not present

## 2014-03-12 DIAGNOSIS — J449 Chronic obstructive pulmonary disease, unspecified: Secondary | ICD-10-CM | POA: Diagnosis not present

## 2014-03-12 DIAGNOSIS — I509 Heart failure, unspecified: Secondary | ICD-10-CM | POA: Diagnosis not present

## 2014-03-12 DIAGNOSIS — I10 Essential (primary) hypertension: Secondary | ICD-10-CM | POA: Diagnosis not present

## 2014-03-12 DIAGNOSIS — H548 Legal blindness, as defined in USA: Secondary | ICD-10-CM | POA: Diagnosis not present

## 2014-03-14 DIAGNOSIS — E119 Type 2 diabetes mellitus without complications: Secondary | ICD-10-CM | POA: Diagnosis not present

## 2014-03-14 DIAGNOSIS — I10 Essential (primary) hypertension: Secondary | ICD-10-CM | POA: Diagnosis not present

## 2014-03-14 DIAGNOSIS — J449 Chronic obstructive pulmonary disease, unspecified: Secondary | ICD-10-CM | POA: Diagnosis not present

## 2014-03-14 DIAGNOSIS — I509 Heart failure, unspecified: Secondary | ICD-10-CM | POA: Diagnosis not present

## 2014-03-14 DIAGNOSIS — R42 Dizziness and giddiness: Secondary | ICD-10-CM | POA: Diagnosis not present

## 2014-03-14 DIAGNOSIS — I739 Peripheral vascular disease, unspecified: Secondary | ICD-10-CM | POA: Diagnosis not present

## 2014-03-19 DIAGNOSIS — R42 Dizziness and giddiness: Secondary | ICD-10-CM | POA: Diagnosis not present

## 2014-03-19 DIAGNOSIS — I10 Essential (primary) hypertension: Secondary | ICD-10-CM | POA: Diagnosis not present

## 2014-03-19 DIAGNOSIS — I509 Heart failure, unspecified: Secondary | ICD-10-CM | POA: Diagnosis not present

## 2014-03-19 DIAGNOSIS — J449 Chronic obstructive pulmonary disease, unspecified: Secondary | ICD-10-CM | POA: Diagnosis not present

## 2014-03-19 DIAGNOSIS — I739 Peripheral vascular disease, unspecified: Secondary | ICD-10-CM | POA: Diagnosis not present

## 2014-03-19 DIAGNOSIS — E119 Type 2 diabetes mellitus without complications: Secondary | ICD-10-CM | POA: Diagnosis not present

## 2014-03-20 DIAGNOSIS — H3532 Exudative age-related macular degeneration: Secondary | ICD-10-CM | POA: Diagnosis not present

## 2014-03-26 DIAGNOSIS — I509 Heart failure, unspecified: Secondary | ICD-10-CM | POA: Diagnosis not present

## 2014-03-26 DIAGNOSIS — J449 Chronic obstructive pulmonary disease, unspecified: Secondary | ICD-10-CM | POA: Diagnosis not present

## 2014-03-26 DIAGNOSIS — I10 Essential (primary) hypertension: Secondary | ICD-10-CM | POA: Diagnosis not present

## 2014-03-26 DIAGNOSIS — R42 Dizziness and giddiness: Secondary | ICD-10-CM | POA: Diagnosis not present

## 2014-03-26 DIAGNOSIS — E119 Type 2 diabetes mellitus without complications: Secondary | ICD-10-CM | POA: Diagnosis not present

## 2014-03-26 DIAGNOSIS — I739 Peripheral vascular disease, unspecified: Secondary | ICD-10-CM | POA: Diagnosis not present

## 2014-04-02 ENCOUNTER — Other Ambulatory Visit: Payer: Self-pay | Admitting: Internal Medicine

## 2014-04-02 DIAGNOSIS — I739 Peripheral vascular disease, unspecified: Secondary | ICD-10-CM | POA: Diagnosis not present

## 2014-04-02 DIAGNOSIS — J449 Chronic obstructive pulmonary disease, unspecified: Secondary | ICD-10-CM | POA: Diagnosis not present

## 2014-04-02 DIAGNOSIS — R42 Dizziness and giddiness: Secondary | ICD-10-CM | POA: Diagnosis not present

## 2014-04-02 DIAGNOSIS — I509 Heart failure, unspecified: Secondary | ICD-10-CM | POA: Diagnosis not present

## 2014-04-02 DIAGNOSIS — I10 Essential (primary) hypertension: Secondary | ICD-10-CM | POA: Diagnosis not present

## 2014-04-02 DIAGNOSIS — E119 Type 2 diabetes mellitus without complications: Secondary | ICD-10-CM | POA: Diagnosis not present

## 2014-04-03 ENCOUNTER — Telehealth: Payer: Self-pay | Admitting: Internal Medicine

## 2014-04-03 MED ORDER — RANITIDINE HCL 300 MG PO TABS: 300.0000 mg | ORAL_TABLET | Freq: Every day | ORAL | Status: AC

## 2014-04-03 NOTE — Telephone Encounter (Signed)
Pt needs ROV with MW. This has been scheduled for 04/06/14 at 3:45pm. Rx will be sent in.

## 2014-04-06 ENCOUNTER — Ambulatory Visit: Payer: Medicare Other | Admitting: Internal Medicine

## 2014-04-09 DIAGNOSIS — E119 Type 2 diabetes mellitus without complications: Secondary | ICD-10-CM | POA: Diagnosis not present

## 2014-04-09 DIAGNOSIS — I739 Peripheral vascular disease, unspecified: Secondary | ICD-10-CM | POA: Diagnosis not present

## 2014-04-09 DIAGNOSIS — R42 Dizziness and giddiness: Secondary | ICD-10-CM | POA: Diagnosis not present

## 2014-04-09 DIAGNOSIS — I10 Essential (primary) hypertension: Secondary | ICD-10-CM | POA: Diagnosis not present

## 2014-04-09 DIAGNOSIS — J449 Chronic obstructive pulmonary disease, unspecified: Secondary | ICD-10-CM | POA: Diagnosis not present

## 2014-04-09 DIAGNOSIS — I509 Heart failure, unspecified: Secondary | ICD-10-CM | POA: Diagnosis not present

## 2014-04-10 DIAGNOSIS — Z79899 Other long term (current) drug therapy: Secondary | ICD-10-CM | POA: Diagnosis not present

## 2014-04-16 DIAGNOSIS — I509 Heart failure, unspecified: Secondary | ICD-10-CM | POA: Diagnosis not present

## 2014-04-16 DIAGNOSIS — J449 Chronic obstructive pulmonary disease, unspecified: Secondary | ICD-10-CM | POA: Diagnosis not present

## 2014-04-16 DIAGNOSIS — E119 Type 2 diabetes mellitus without complications: Secondary | ICD-10-CM | POA: Diagnosis not present

## 2014-04-16 DIAGNOSIS — I739 Peripheral vascular disease, unspecified: Secondary | ICD-10-CM | POA: Diagnosis not present

## 2014-04-16 DIAGNOSIS — I10 Essential (primary) hypertension: Secondary | ICD-10-CM | POA: Diagnosis not present

## 2014-04-16 DIAGNOSIS — R42 Dizziness and giddiness: Secondary | ICD-10-CM | POA: Diagnosis not present

## 2014-04-17 DIAGNOSIS — L728 Other follicular cysts of the skin and subcutaneous tissue: Secondary | ICD-10-CM | POA: Diagnosis not present

## 2014-04-17 DIAGNOSIS — L570 Actinic keratosis: Secondary | ICD-10-CM | POA: Diagnosis not present

## 2014-04-17 DIAGNOSIS — L300 Nummular dermatitis: Secondary | ICD-10-CM | POA: Diagnosis not present

## 2014-04-23 DIAGNOSIS — J449 Chronic obstructive pulmonary disease, unspecified: Secondary | ICD-10-CM | POA: Diagnosis not present

## 2014-04-23 DIAGNOSIS — E119 Type 2 diabetes mellitus without complications: Secondary | ICD-10-CM | POA: Diagnosis not present

## 2014-04-23 DIAGNOSIS — I10 Essential (primary) hypertension: Secondary | ICD-10-CM | POA: Diagnosis not present

## 2014-04-23 DIAGNOSIS — I509 Heart failure, unspecified: Secondary | ICD-10-CM | POA: Diagnosis not present

## 2014-04-23 DIAGNOSIS — I739 Peripheral vascular disease, unspecified: Secondary | ICD-10-CM | POA: Diagnosis not present

## 2014-04-23 DIAGNOSIS — R42 Dizziness and giddiness: Secondary | ICD-10-CM | POA: Diagnosis not present

## 2014-04-30 DIAGNOSIS — I509 Heart failure, unspecified: Secondary | ICD-10-CM | POA: Diagnosis not present

## 2014-04-30 DIAGNOSIS — I739 Peripheral vascular disease, unspecified: Secondary | ICD-10-CM | POA: Diagnosis not present

## 2014-04-30 DIAGNOSIS — I10 Essential (primary) hypertension: Secondary | ICD-10-CM | POA: Diagnosis not present

## 2014-04-30 DIAGNOSIS — E119 Type 2 diabetes mellitus without complications: Secondary | ICD-10-CM | POA: Diagnosis not present

## 2014-04-30 DIAGNOSIS — R42 Dizziness and giddiness: Secondary | ICD-10-CM | POA: Diagnosis not present

## 2014-04-30 DIAGNOSIS — J449 Chronic obstructive pulmonary disease, unspecified: Secondary | ICD-10-CM | POA: Diagnosis not present

## 2014-05-03 ENCOUNTER — Ambulatory Visit: Payer: Medicare Other | Admitting: Internal Medicine

## 2014-05-07 DIAGNOSIS — I739 Peripheral vascular disease, unspecified: Secondary | ICD-10-CM | POA: Diagnosis not present

## 2014-05-07 DIAGNOSIS — I10 Essential (primary) hypertension: Secondary | ICD-10-CM | POA: Diagnosis not present

## 2014-05-07 DIAGNOSIS — R42 Dizziness and giddiness: Secondary | ICD-10-CM | POA: Diagnosis not present

## 2014-05-07 DIAGNOSIS — I509 Heart failure, unspecified: Secondary | ICD-10-CM | POA: Diagnosis not present

## 2014-05-07 DIAGNOSIS — J449 Chronic obstructive pulmonary disease, unspecified: Secondary | ICD-10-CM | POA: Diagnosis not present

## 2014-05-07 DIAGNOSIS — E119 Type 2 diabetes mellitus without complications: Secondary | ICD-10-CM | POA: Diagnosis not present

## 2014-05-09 DIAGNOSIS — R42 Dizziness and giddiness: Secondary | ICD-10-CM | POA: Diagnosis not present

## 2014-05-09 DIAGNOSIS — I10 Essential (primary) hypertension: Secondary | ICD-10-CM | POA: Diagnosis not present

## 2014-05-09 DIAGNOSIS — J449 Chronic obstructive pulmonary disease, unspecified: Secondary | ICD-10-CM | POA: Diagnosis not present

## 2014-05-09 DIAGNOSIS — I739 Peripheral vascular disease, unspecified: Secondary | ICD-10-CM | POA: Diagnosis not present

## 2014-05-09 DIAGNOSIS — I509 Heart failure, unspecified: Secondary | ICD-10-CM | POA: Diagnosis not present

## 2014-05-09 DIAGNOSIS — E119 Type 2 diabetes mellitus without complications: Secondary | ICD-10-CM | POA: Diagnosis not present

## 2014-05-10 DIAGNOSIS — I498 Other specified cardiac arrhythmias: Secondary | ICD-10-CM | POA: Diagnosis not present

## 2014-05-11 ENCOUNTER — Other Ambulatory Visit: Payer: Self-pay

## 2014-05-11 DIAGNOSIS — E119 Type 2 diabetes mellitus without complications: Secondary | ICD-10-CM | POA: Diagnosis not present

## 2014-05-11 DIAGNOSIS — I10 Essential (primary) hypertension: Secondary | ICD-10-CM | POA: Diagnosis not present

## 2014-05-11 DIAGNOSIS — J449 Chronic obstructive pulmonary disease, unspecified: Secondary | ICD-10-CM | POA: Diagnosis not present

## 2014-05-11 DIAGNOSIS — I509 Heart failure, unspecified: Secondary | ICD-10-CM | POA: Diagnosis not present

## 2014-05-11 DIAGNOSIS — I739 Peripheral vascular disease, unspecified: Secondary | ICD-10-CM | POA: Diagnosis not present

## 2014-05-11 DIAGNOSIS — R42 Dizziness and giddiness: Secondary | ICD-10-CM | POA: Diagnosis not present

## 2014-05-11 NOTE — Patient Outreach (Signed)
I called Thomas Floyd to determine his pharmacy needs.  He was referred to me by [place] social worker, [name] to assist with medications understanding and pill box fills.  Mr. Mangan stated he only needed assistance with filling his pill boxes weekly.  He has difficulty seeing since he is legally blind.  I stated that filling pill boxes is not a service [facility] Care Management provides long term.  I gave him a list of pharmacies in [place] that will provide blister packs for a small fee.  I also suggested he call his pharmacy and determine if they would be willing to fill his pill boxes for him since he was a long time customer of theirs.  He stated he would call them.  I also suggested personal care services may be able to assist.  He did not seem interested in that currently.  At this time, Mr. Rathert does not need pharmacy services.    [name], PharmD, [place] [phone]

## 2014-05-14 DIAGNOSIS — M545 Low back pain: Secondary | ICD-10-CM | POA: Diagnosis not present

## 2014-05-15 DIAGNOSIS — C61 Malignant neoplasm of prostate: Secondary | ICD-10-CM | POA: Diagnosis not present

## 2014-05-15 DIAGNOSIS — M6281 Muscle weakness (generalized): Secondary | ICD-10-CM | POA: Diagnosis not present

## 2014-05-15 DIAGNOSIS — M545 Low back pain: Secondary | ICD-10-CM | POA: Diagnosis not present

## 2014-05-15 DIAGNOSIS — D509 Iron deficiency anemia, unspecified: Secondary | ICD-10-CM | POA: Diagnosis not present

## 2014-05-15 DIAGNOSIS — R293 Abnormal posture: Secondary | ICD-10-CM | POA: Diagnosis not present

## 2014-05-17 ENCOUNTER — Other Ambulatory Visit: Payer: Self-pay

## 2014-05-17 DIAGNOSIS — M545 Low back pain: Secondary | ICD-10-CM | POA: Diagnosis not present

## 2014-05-17 DIAGNOSIS — D509 Iron deficiency anemia, unspecified: Secondary | ICD-10-CM | POA: Diagnosis not present

## 2014-05-17 DIAGNOSIS — R293 Abnormal posture: Secondary | ICD-10-CM | POA: Diagnosis not present

## 2014-05-17 DIAGNOSIS — C61 Malignant neoplasm of prostate: Secondary | ICD-10-CM | POA: Diagnosis not present

## 2014-05-17 DIAGNOSIS — M6281 Muscle weakness (generalized): Secondary | ICD-10-CM | POA: Diagnosis not present

## 2014-05-17 NOTE — Patient Outreach (Addendum)
[facility]) Care Management  05/17/2014  Thomas Floyd 07-05-1927 [id number]   SUBJECTIVE;  Telephone call to patient regarding [facility] referral.  Discussed and offered [facility] care management services with patient.  Patient refused services.  Patient states all he wants is someone to come to his home and fill his pill box ongoing. Patient states he has macular degeneration.  Patient verbalizes he doesn't drive.   Patient confirms he spoke with Thomas Floyd, pharmacist with [facility] care management who gave patient names of pharmacies in [place] that could provide Bubble packs for patients medications.  Patient states he does not want that.  Patient states, "I only want someone to come to my home and fill my pill box and I want Medicare to pay for it."  Patient states even if a company was found to provide the service of filling his pill box he is unwilling to pay anything out of pocket.  Patient states he wants Medicare to pay.    ASSESSMENT: Referral received from [name] Thomas Floyd, Education officer, museum) for ongoing medication management / education /monitoring / limited support system/ legally blind / and high risk duet to multiple chronic conditions.  [facility] discharged patient on 05/11/14 due to no further skilled needs.  Information provided to patient for medication concern by Thomas Floyd, pharmacist with [facility] care management. RNCM spoke with Thomas Floyd and notified her of patients refusal of services.    PLAN:  RNCM will refer patient to Thomas Floyd to close due to refusal of services RNCM will notify patients primary MD and Thomas Floyd with [facility] of patients refusal of services.    Thomas Plowman RN,BSN,CCM [place] Coordinator [phone]

## 2014-05-18 DIAGNOSIS — M6281 Muscle weakness (generalized): Secondary | ICD-10-CM | POA: Diagnosis not present

## 2014-05-18 DIAGNOSIS — R293 Abnormal posture: Secondary | ICD-10-CM | POA: Diagnosis not present

## 2014-05-18 DIAGNOSIS — M545 Low back pain: Secondary | ICD-10-CM | POA: Diagnosis not present

## 2014-05-22 DIAGNOSIS — M545 Low back pain: Secondary | ICD-10-CM | POA: Diagnosis not present

## 2014-05-22 DIAGNOSIS — H3531 Nonexudative age-related macular degeneration: Secondary | ICD-10-CM | POA: Diagnosis not present

## 2014-05-22 DIAGNOSIS — M6281 Muscle weakness (generalized): Secondary | ICD-10-CM | POA: Diagnosis not present

## 2014-05-22 DIAGNOSIS — R293 Abnormal posture: Secondary | ICD-10-CM | POA: Diagnosis not present

## 2014-05-24 DIAGNOSIS — R293 Abnormal posture: Secondary | ICD-10-CM | POA: Diagnosis not present

## 2014-05-24 DIAGNOSIS — M545 Low back pain: Secondary | ICD-10-CM | POA: Diagnosis not present

## 2014-05-24 DIAGNOSIS — M6281 Muscle weakness (generalized): Secondary | ICD-10-CM | POA: Diagnosis not present

## 2014-05-25 DIAGNOSIS — E785 Hyperlipidemia, unspecified: Secondary | ICD-10-CM | POA: Diagnosis not present

## 2014-05-25 DIAGNOSIS — J209 Acute bronchitis, unspecified: Secondary | ICD-10-CM | POA: Diagnosis not present

## 2014-05-25 DIAGNOSIS — E119 Type 2 diabetes mellitus without complications: Secondary | ICD-10-CM | POA: Diagnosis not present

## 2014-05-25 DIAGNOSIS — I10 Essential (primary) hypertension: Secondary | ICD-10-CM | POA: Diagnosis not present

## 2014-05-29 DIAGNOSIS — J329 Chronic sinusitis, unspecified: Secondary | ICD-10-CM | POA: Diagnosis not present

## 2014-05-29 DIAGNOSIS — J302 Other seasonal allergic rhinitis: Secondary | ICD-10-CM | POA: Diagnosis not present

## 2014-05-29 DIAGNOSIS — K5909 Other constipation: Secondary | ICD-10-CM | POA: Diagnosis not present

## 2014-05-30 DIAGNOSIS — I739 Peripheral vascular disease, unspecified: Secondary | ICD-10-CM | POA: Diagnosis not present

## 2014-05-30 DIAGNOSIS — E119 Type 2 diabetes mellitus without complications: Secondary | ICD-10-CM | POA: Diagnosis not present

## 2014-05-30 DIAGNOSIS — I2510 Atherosclerotic heart disease of native coronary artery without angina pectoris: Secondary | ICD-10-CM | POA: Diagnosis not present

## 2014-06-05 DIAGNOSIS — I70209 Unspecified atherosclerosis of native arteries of extremities, unspecified extremity: Secondary | ICD-10-CM | POA: Diagnosis not present

## 2014-06-05 DIAGNOSIS — I739 Peripheral vascular disease, unspecified: Secondary | ICD-10-CM | POA: Diagnosis not present

## 2014-06-05 DIAGNOSIS — E1159 Type 2 diabetes mellitus with other circulatory complications: Secondary | ICD-10-CM | POA: Diagnosis not present

## 2014-06-05 DIAGNOSIS — B351 Tinea unguium: Secondary | ICD-10-CM | POA: Diagnosis not present

## 2014-06-14 DIAGNOSIS — M545 Low back pain: Secondary | ICD-10-CM | POA: Diagnosis not present

## 2014-06-14 DIAGNOSIS — M6281 Muscle weakness (generalized): Secondary | ICD-10-CM | POA: Diagnosis not present

## 2014-06-14 DIAGNOSIS — R293 Abnormal posture: Secondary | ICD-10-CM | POA: Diagnosis not present

## 2014-06-19 DIAGNOSIS — K219 Gastro-esophageal reflux disease without esophagitis: Secondary | ICD-10-CM | POA: Diagnosis not present

## 2014-06-19 DIAGNOSIS — J302 Other seasonal allergic rhinitis: Secondary | ICD-10-CM | POA: Diagnosis not present

## 2014-06-19 DIAGNOSIS — J449 Chronic obstructive pulmonary disease, unspecified: Secondary | ICD-10-CM | POA: Diagnosis not present

## 2014-06-19 DIAGNOSIS — H6120 Impacted cerumen, unspecified ear: Secondary | ICD-10-CM | POA: Diagnosis not present

## 2014-06-26 DIAGNOSIS — M6281 Muscle weakness (generalized): Secondary | ICD-10-CM | POA: Diagnosis not present

## 2014-06-26 DIAGNOSIS — M545 Low back pain: Secondary | ICD-10-CM | POA: Diagnosis not present

## 2014-06-26 DIAGNOSIS — R293 Abnormal posture: Secondary | ICD-10-CM | POA: Diagnosis not present

## 2014-06-29 DIAGNOSIS — Z79899 Other long term (current) drug therapy: Secondary | ICD-10-CM | POA: Diagnosis not present

## 2014-06-29 DIAGNOSIS — D649 Anemia, unspecified: Secondary | ICD-10-CM | POA: Diagnosis not present

## 2014-07-10 DIAGNOSIS — H3532 Exudative age-related macular degeneration: Secondary | ICD-10-CM | POA: Diagnosis not present

## 2014-07-17 DIAGNOSIS — D0439 Carcinoma in situ of skin of other parts of face: Secondary | ICD-10-CM | POA: Diagnosis not present

## 2014-07-17 DIAGNOSIS — C44319 Basal cell carcinoma of skin of other parts of face: Secondary | ICD-10-CM | POA: Diagnosis not present

## 2014-07-17 DIAGNOSIS — M542 Cervicalgia: Secondary | ICD-10-CM | POA: Diagnosis not present

## 2014-07-20 DIAGNOSIS — M542 Cervicalgia: Secondary | ICD-10-CM | POA: Diagnosis not present

## 2014-07-21 DIAGNOSIS — N189 Chronic kidney disease, unspecified: Secondary | ICD-10-CM | POA: Diagnosis not present

## 2014-07-21 DIAGNOSIS — J449 Chronic obstructive pulmonary disease, unspecified: Secondary | ICD-10-CM | POA: Diagnosis not present

## 2014-07-21 DIAGNOSIS — E569 Vitamin deficiency, unspecified: Secondary | ICD-10-CM | POA: Diagnosis not present

## 2014-07-21 DIAGNOSIS — K219 Gastro-esophageal reflux disease without esophagitis: Secondary | ICD-10-CM | POA: Diagnosis not present

## 2014-07-25 DIAGNOSIS — M542 Cervicalgia: Secondary | ICD-10-CM | POA: Diagnosis not present

## 2014-08-09 DIAGNOSIS — I498 Other specified cardiac arrhythmias: Secondary | ICD-10-CM | POA: Diagnosis not present

## 2014-08-09 DIAGNOSIS — Z45018 Encounter for adjustment and management of other part of cardiac pacemaker: Secondary | ICD-10-CM | POA: Diagnosis not present

## 2014-08-28 DIAGNOSIS — Z45018 Encounter for adjustment and management of other part of cardiac pacemaker: Secondary | ICD-10-CM | POA: Insufficient documentation

## 2014-08-28 DIAGNOSIS — I2510 Atherosclerotic heart disease of native coronary artery without angina pectoris: Secondary | ICD-10-CM | POA: Diagnosis not present

## 2014-08-28 DIAGNOSIS — E785 Hyperlipidemia, unspecified: Secondary | ICD-10-CM | POA: Diagnosis not present

## 2014-08-28 DIAGNOSIS — I10 Essential (primary) hypertension: Secondary | ICD-10-CM | POA: Insufficient documentation

## 2014-08-28 DIAGNOSIS — Z950 Presence of cardiac pacemaker: Secondary | ICD-10-CM | POA: Diagnosis not present

## 2014-09-03 DIAGNOSIS — E782 Mixed hyperlipidemia: Secondary | ICD-10-CM | POA: Diagnosis not present

## 2014-09-03 DIAGNOSIS — E1142 Type 2 diabetes mellitus with diabetic polyneuropathy: Secondary | ICD-10-CM | POA: Diagnosis not present

## 2014-09-11 DIAGNOSIS — I10 Essential (primary) hypertension: Secondary | ICD-10-CM | POA: Diagnosis not present

## 2014-09-11 DIAGNOSIS — H26492 Other secondary cataract, left eye: Secondary | ICD-10-CM | POA: Diagnosis not present

## 2014-09-11 DIAGNOSIS — Z961 Presence of intraocular lens: Secondary | ICD-10-CM | POA: Diagnosis not present

## 2014-09-11 DIAGNOSIS — H26499 Other secondary cataract, unspecified eye: Secondary | ICD-10-CM | POA: Diagnosis not present

## 2014-09-11 DIAGNOSIS — H3531 Nonexudative age-related macular degeneration: Secondary | ICD-10-CM | POA: Diagnosis not present

## 2014-09-11 DIAGNOSIS — Z9842 Cataract extraction status, left eye: Secondary | ICD-10-CM | POA: Diagnosis not present

## 2014-09-11 DIAGNOSIS — Z9849 Cataract extraction status, unspecified eye: Secondary | ICD-10-CM | POA: Diagnosis not present

## 2014-09-17 DIAGNOSIS — I119 Hypertensive heart disease without heart failure: Secondary | ICD-10-CM | POA: Diagnosis not present

## 2014-09-17 DIAGNOSIS — E782 Mixed hyperlipidemia: Secondary | ICD-10-CM | POA: Diagnosis not present

## 2014-09-25 DIAGNOSIS — I70209 Unspecified atherosclerosis of native arteries of extremities, unspecified extremity: Secondary | ICD-10-CM | POA: Diagnosis not present

## 2014-09-25 DIAGNOSIS — E1159 Type 2 diabetes mellitus with other circulatory complications: Secondary | ICD-10-CM | POA: Diagnosis not present

## 2014-09-25 DIAGNOSIS — B351 Tinea unguium: Secondary | ICD-10-CM | POA: Diagnosis not present

## 2014-09-25 DIAGNOSIS — I739 Peripheral vascular disease, unspecified: Secondary | ICD-10-CM | POA: Diagnosis not present

## 2014-10-02 DIAGNOSIS — R233 Spontaneous ecchymoses: Secondary | ICD-10-CM | POA: Diagnosis not present

## 2014-10-02 DIAGNOSIS — L820 Inflamed seborrheic keratosis: Secondary | ICD-10-CM | POA: Diagnosis not present

## 2014-10-02 DIAGNOSIS — L853 Xerosis cutis: Secondary | ICD-10-CM | POA: Diagnosis not present

## 2014-10-08 DIAGNOSIS — J342 Deviated nasal septum: Secondary | ICD-10-CM | POA: Diagnosis not present

## 2014-10-08 DIAGNOSIS — Z8709 Personal history of other diseases of the respiratory system: Secondary | ICD-10-CM | POA: Diagnosis not present

## 2014-10-08 DIAGNOSIS — M950 Acquired deformity of nose: Secondary | ICD-10-CM | POA: Diagnosis not present

## 2014-10-08 DIAGNOSIS — R05 Cough: Secondary | ICD-10-CM | POA: Diagnosis not present

## 2014-10-08 DIAGNOSIS — J3489 Other specified disorders of nose and nasal sinuses: Secondary | ICD-10-CM | POA: Diagnosis not present

## 2014-10-09 DIAGNOSIS — H3532 Exudative age-related macular degeneration: Secondary | ICD-10-CM | POA: Diagnosis not present

## 2014-10-18 DIAGNOSIS — Z23 Encounter for immunization: Secondary | ICD-10-CM | POA: Diagnosis not present

## 2014-11-13 DIAGNOSIS — D509 Iron deficiency anemia, unspecified: Secondary | ICD-10-CM | POA: Diagnosis not present

## 2014-11-13 DIAGNOSIS — C61 Malignant neoplasm of prostate: Secondary | ICD-10-CM | POA: Diagnosis not present

## 2014-11-13 DIAGNOSIS — Z45018 Encounter for adjustment and management of other part of cardiac pacemaker: Secondary | ICD-10-CM | POA: Diagnosis not present

## 2014-11-13 DIAGNOSIS — I498 Other specified cardiac arrhythmias: Secondary | ICD-10-CM | POA: Diagnosis not present

## 2014-11-13 DIAGNOSIS — C44722 Squamous cell carcinoma of skin of right lower limb, including hip: Secondary | ICD-10-CM | POA: Diagnosis not present

## 2014-11-20 DIAGNOSIS — D649 Anemia, unspecified: Secondary | ICD-10-CM | POA: Diagnosis not present

## 2014-11-20 DIAGNOSIS — D509 Iron deficiency anemia, unspecified: Secondary | ICD-10-CM | POA: Diagnosis not present

## 2014-11-20 DIAGNOSIS — C61 Malignant neoplasm of prostate: Secondary | ICD-10-CM | POA: Diagnosis not present

## 2014-11-21 DIAGNOSIS — L570 Actinic keratosis: Secondary | ICD-10-CM | POA: Diagnosis not present

## 2014-11-21 DIAGNOSIS — C44629 Squamous cell carcinoma of skin of left upper limb, including shoulder: Secondary | ICD-10-CM | POA: Diagnosis not present

## 2014-11-25 DIAGNOSIS — J189 Pneumonia, unspecified organism: Secondary | ICD-10-CM | POA: Diagnosis not present

## 2014-11-27 DIAGNOSIS — K5521 Angiodysplasia of colon with hemorrhage: Secondary | ICD-10-CM | POA: Diagnosis not present

## 2014-11-27 DIAGNOSIS — D500 Iron deficiency anemia secondary to blood loss (chronic): Secondary | ICD-10-CM | POA: Diagnosis not present

## 2014-12-03 DIAGNOSIS — Z79899 Other long term (current) drug therapy: Secondary | ICD-10-CM | POA: Diagnosis not present

## 2014-12-03 DIAGNOSIS — J189 Pneumonia, unspecified organism: Secondary | ICD-10-CM | POA: Diagnosis not present

## 2014-12-03 DIAGNOSIS — B351 Tinea unguium: Secondary | ICD-10-CM | POA: Diagnosis not present

## 2014-12-04 DIAGNOSIS — C44629 Squamous cell carcinoma of skin of left upper limb, including shoulder: Secondary | ICD-10-CM | POA: Diagnosis not present

## 2014-12-13 DIAGNOSIS — R5383 Other fatigue: Secondary | ICD-10-CM | POA: Diagnosis not present

## 2014-12-13 DIAGNOSIS — I739 Peripheral vascular disease, unspecified: Secondary | ICD-10-CM | POA: Diagnosis not present

## 2014-12-13 DIAGNOSIS — E1143 Type 2 diabetes mellitus with diabetic autonomic (poly)neuropathy: Secondary | ICD-10-CM | POA: Diagnosis not present

## 2014-12-13 DIAGNOSIS — J209 Acute bronchitis, unspecified: Secondary | ICD-10-CM | POA: Diagnosis not present

## 2014-12-25 DIAGNOSIS — D500 Iron deficiency anemia secondary to blood loss (chronic): Secondary | ICD-10-CM | POA: Diagnosis not present

## 2015-01-01 DIAGNOSIS — D500 Iron deficiency anemia secondary to blood loss (chronic): Secondary | ICD-10-CM | POA: Diagnosis not present

## 2015-01-01 DIAGNOSIS — K5521 Angiodysplasia of colon with hemorrhage: Secondary | ICD-10-CM | POA: Diagnosis not present

## 2015-01-01 DIAGNOSIS — C44629 Squamous cell carcinoma of skin of left upper limb, including shoulder: Secondary | ICD-10-CM | POA: Diagnosis not present

## 2015-01-01 DIAGNOSIS — K2971 Gastritis, unspecified, with bleeding: Secondary | ICD-10-CM | POA: Diagnosis not present

## 2015-01-01 DIAGNOSIS — K5900 Constipation, unspecified: Secondary | ICD-10-CM | POA: Diagnosis not present

## 2015-01-08 DIAGNOSIS — H353232 Exudative age-related macular degeneration, bilateral, with inactive choroidal neovascularization: Secondary | ICD-10-CM | POA: Diagnosis not present

## 2015-01-15 DIAGNOSIS — I70209 Unspecified atherosclerosis of native arteries of extremities, unspecified extremity: Secondary | ICD-10-CM | POA: Diagnosis not present

## 2015-01-15 DIAGNOSIS — E1159 Type 2 diabetes mellitus with other circulatory complications: Secondary | ICD-10-CM | POA: Diagnosis not present

## 2015-01-15 DIAGNOSIS — B351 Tinea unguium: Secondary | ICD-10-CM | POA: Diagnosis not present

## 2015-01-29 DIAGNOSIS — C44629 Squamous cell carcinoma of skin of left upper limb, including shoulder: Secondary | ICD-10-CM | POA: Diagnosis not present

## 2015-01-29 DIAGNOSIS — L570 Actinic keratosis: Secondary | ICD-10-CM | POA: Diagnosis not present

## 2015-01-29 DIAGNOSIS — R262 Difficulty in walking, not elsewhere classified: Secondary | ICD-10-CM | POA: Diagnosis not present

## 2015-01-29 DIAGNOSIS — L821 Other seborrheic keratosis: Secondary | ICD-10-CM | POA: Diagnosis not present

## 2015-01-29 DIAGNOSIS — M6281 Muscle weakness (generalized): Secondary | ICD-10-CM | POA: Diagnosis not present

## 2015-01-29 DIAGNOSIS — R293 Abnormal posture: Secondary | ICD-10-CM | POA: Diagnosis not present

## 2015-01-29 DIAGNOSIS — M545 Low back pain: Secondary | ICD-10-CM | POA: Diagnosis not present

## 2015-01-31 DIAGNOSIS — R262 Difficulty in walking, not elsewhere classified: Secondary | ICD-10-CM | POA: Diagnosis not present

## 2015-01-31 DIAGNOSIS — M545 Low back pain: Secondary | ICD-10-CM | POA: Diagnosis not present

## 2015-01-31 DIAGNOSIS — R293 Abnormal posture: Secondary | ICD-10-CM | POA: Diagnosis not present

## 2015-01-31 DIAGNOSIS — M6281 Muscle weakness (generalized): Secondary | ICD-10-CM | POA: Diagnosis not present

## 2015-02-05 DIAGNOSIS — R293 Abnormal posture: Secondary | ICD-10-CM | POA: Diagnosis not present

## 2015-02-05 DIAGNOSIS — R262 Difficulty in walking, not elsewhere classified: Secondary | ICD-10-CM | POA: Diagnosis not present

## 2015-02-05 DIAGNOSIS — M6281 Muscle weakness (generalized): Secondary | ICD-10-CM | POA: Diagnosis not present

## 2015-02-05 DIAGNOSIS — M545 Low back pain: Secondary | ICD-10-CM | POA: Diagnosis not present

## 2015-02-07 DIAGNOSIS — R293 Abnormal posture: Secondary | ICD-10-CM | POA: Diagnosis not present

## 2015-02-07 DIAGNOSIS — M6281 Muscle weakness (generalized): Secondary | ICD-10-CM | POA: Diagnosis not present

## 2015-02-07 DIAGNOSIS — R262 Difficulty in walking, not elsewhere classified: Secondary | ICD-10-CM | POA: Diagnosis not present

## 2015-02-07 DIAGNOSIS — M545 Low back pain: Secondary | ICD-10-CM | POA: Diagnosis not present

## 2015-02-12 DIAGNOSIS — M6281 Muscle weakness (generalized): Secondary | ICD-10-CM | POA: Diagnosis not present

## 2015-02-12 DIAGNOSIS — R293 Abnormal posture: Secondary | ICD-10-CM | POA: Diagnosis not present

## 2015-02-12 DIAGNOSIS — R262 Difficulty in walking, not elsewhere classified: Secondary | ICD-10-CM | POA: Diagnosis not present

## 2015-02-12 DIAGNOSIS — M545 Low back pain: Secondary | ICD-10-CM | POA: Diagnosis not present

## 2015-02-14 DIAGNOSIS — R262 Difficulty in walking, not elsewhere classified: Secondary | ICD-10-CM | POA: Diagnosis not present

## 2015-02-14 DIAGNOSIS — R293 Abnormal posture: Secondary | ICD-10-CM | POA: Diagnosis not present

## 2015-02-14 DIAGNOSIS — M6281 Muscle weakness (generalized): Secondary | ICD-10-CM | POA: Diagnosis not present

## 2015-02-14 DIAGNOSIS — M545 Low back pain: Secondary | ICD-10-CM | POA: Diagnosis not present

## 2015-02-16 DIAGNOSIS — J0100 Acute maxillary sinusitis, unspecified: Secondary | ICD-10-CM | POA: Diagnosis not present

## 2015-02-19 DIAGNOSIS — D509 Iron deficiency anemia, unspecified: Secondary | ICD-10-CM | POA: Diagnosis not present

## 2015-02-19 DIAGNOSIS — C61 Malignant neoplasm of prostate: Secondary | ICD-10-CM | POA: Diagnosis not present

## 2015-02-25 DIAGNOSIS — Z45018 Encounter for adjustment and management of other part of cardiac pacemaker: Secondary | ICD-10-CM | POA: Diagnosis not present

## 2015-02-25 DIAGNOSIS — I498 Other specified cardiac arrhythmias: Secondary | ICD-10-CM | POA: Diagnosis not present

## 2015-02-25 IMAGING — CR DG CHEST 2V
2 series · 2 of 2 positions shown · non-contrast
Comparison: 03/08/2012.  04/25/2010.

CLINICAL DATA: COPD.  Ex-smoker.

EXAM:
CHEST  2 VIEW

[view not recorded (1 of 2)]
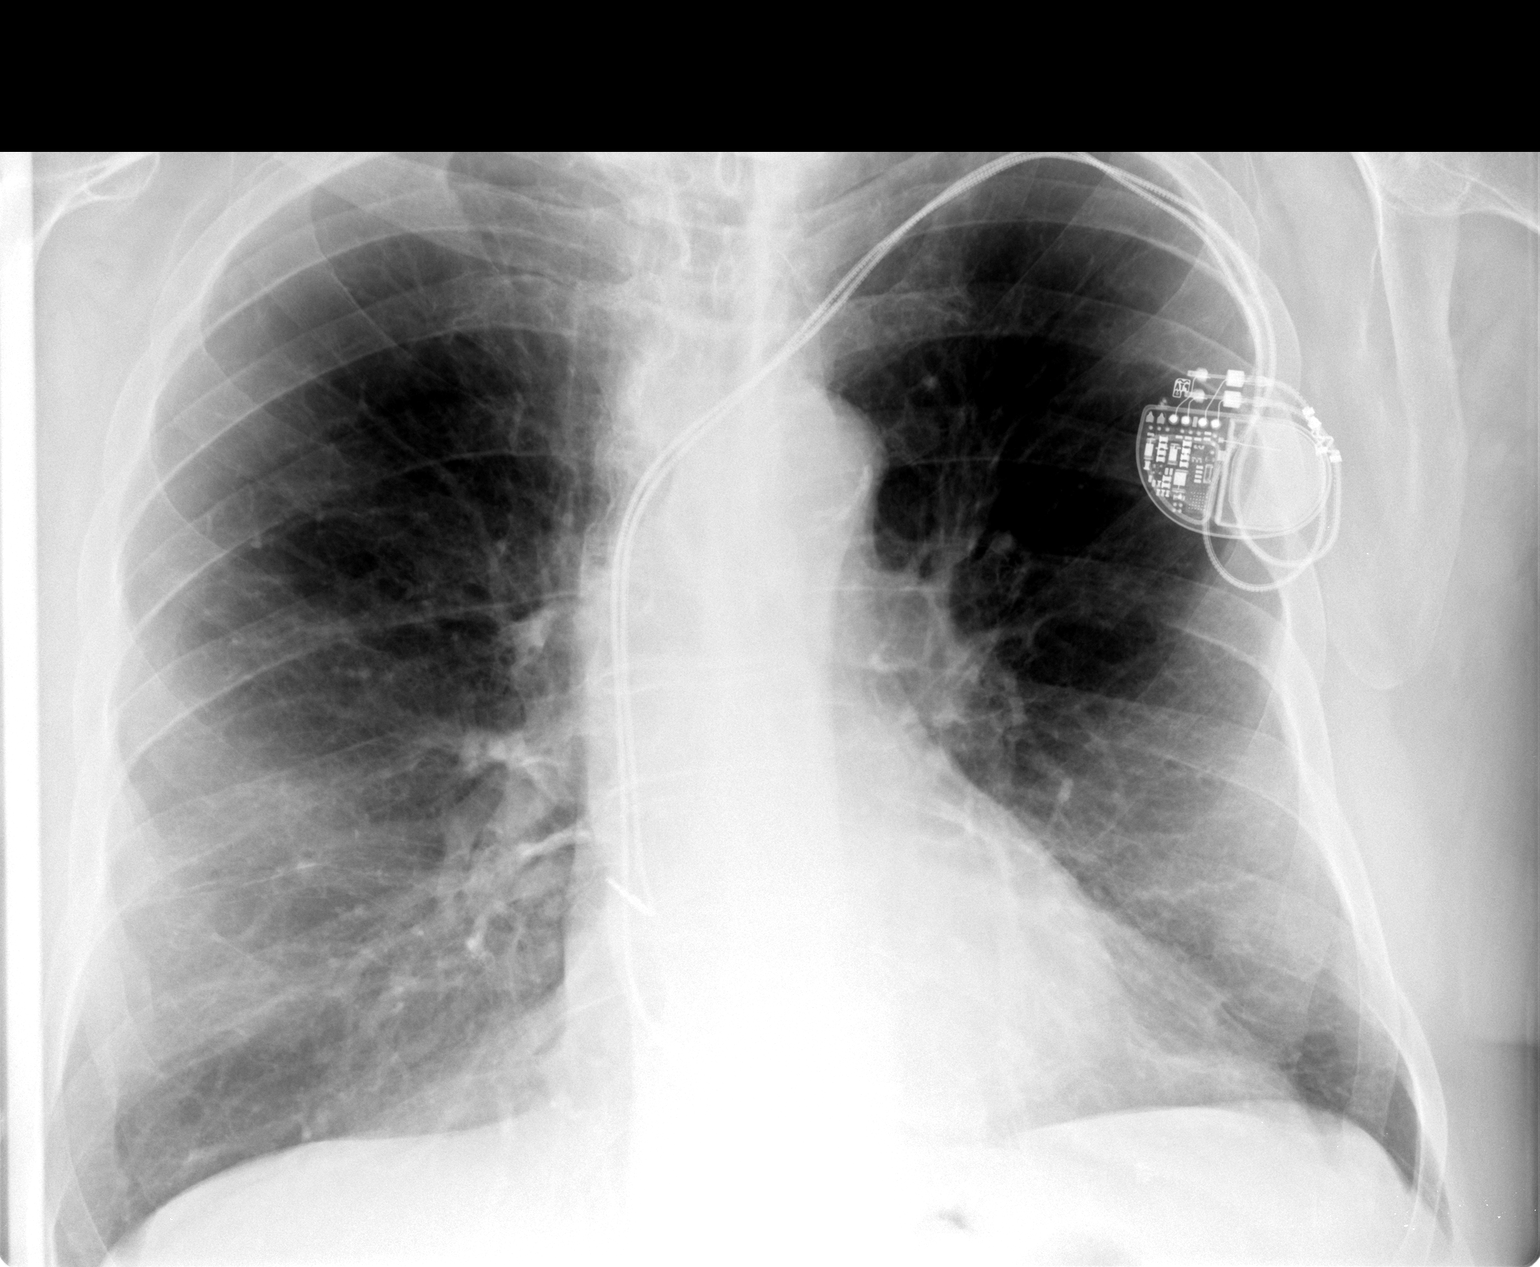

[view not recorded (2 of 2)]
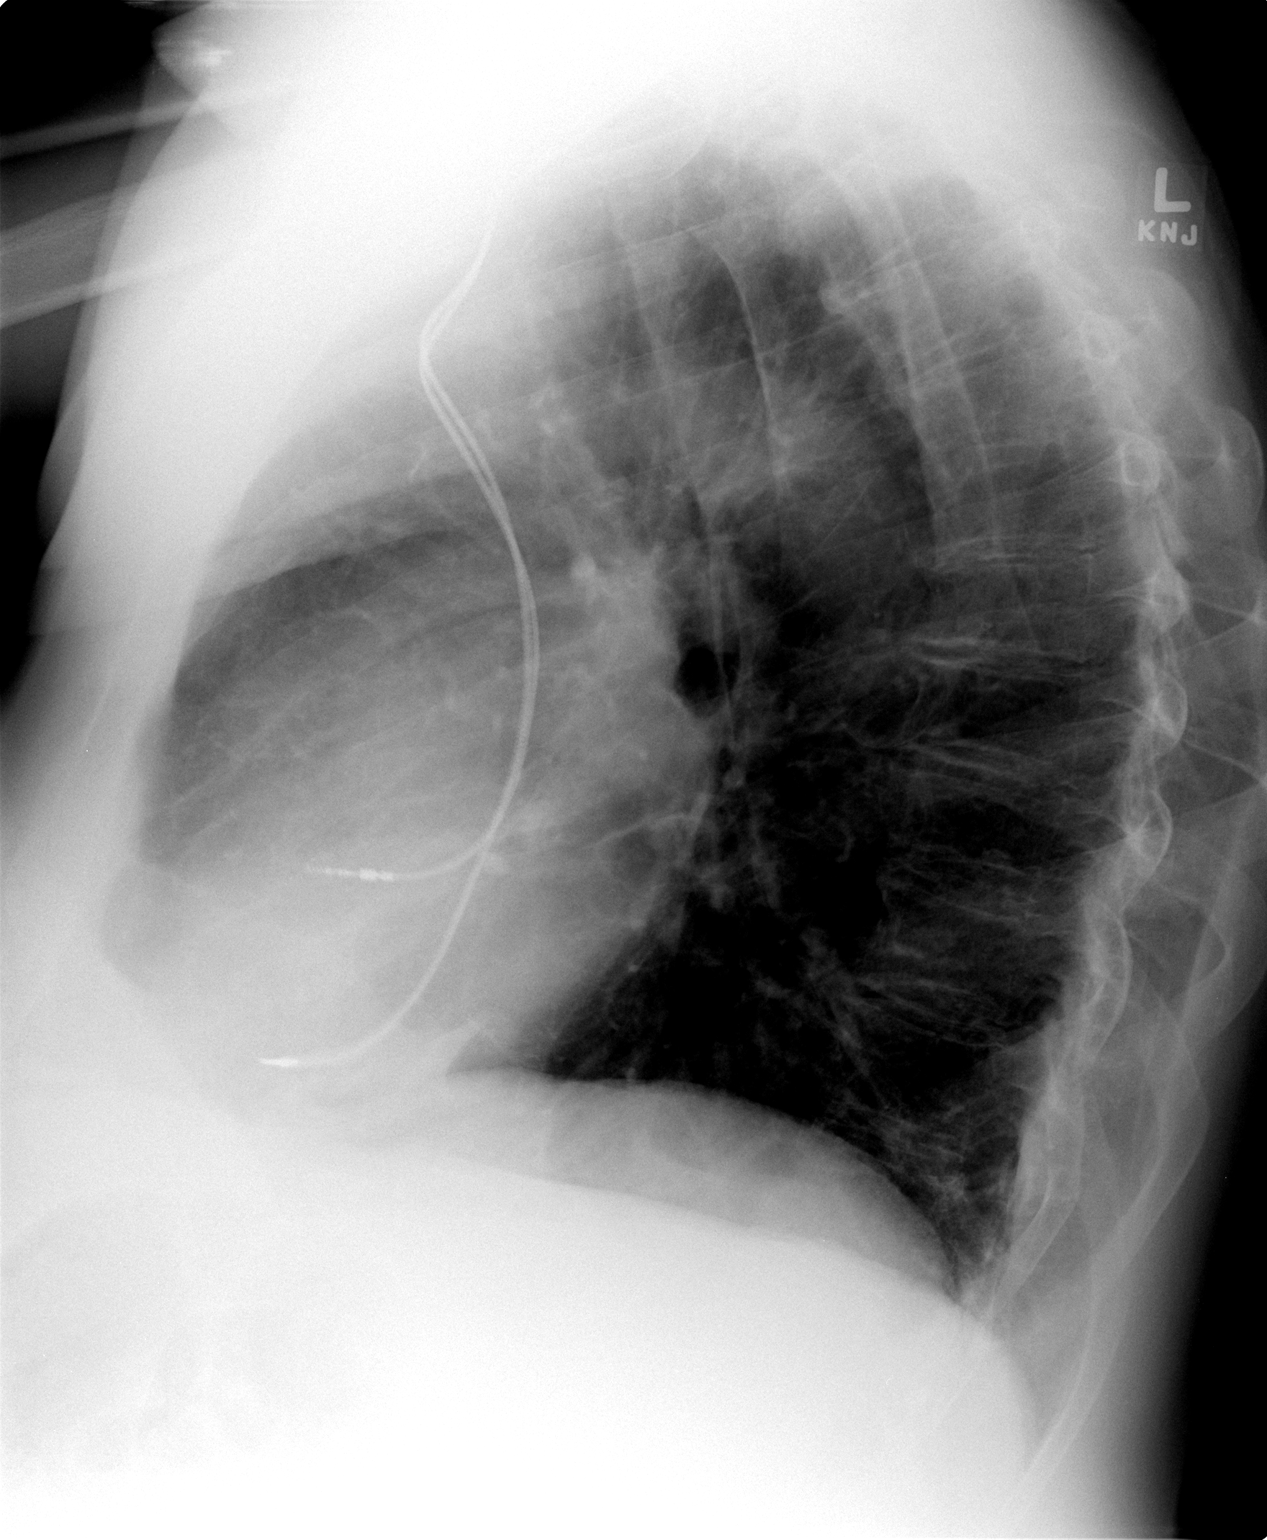

[2 of 2 positions shown; findings below may reference images not displayed]

FINDINGS: Left subclavian pacemaker leads appear unchanged within the right
atrium and right ventricle. The heart size and mediastinal contours
are stable. The lungs remain hyperinflated but clear. There is no
pleural effusion or pneumothorax. No acute osseous findings are
evident.
IMPRESSION: Stable chest with findings of chronic obstructive pulmonary disease.
No acute cardiopulmonary process.

## 2015-02-28 DIAGNOSIS — C44629 Squamous cell carcinoma of skin of left upper limb, including shoulder: Secondary | ICD-10-CM | POA: Diagnosis not present

## 2015-02-28 DIAGNOSIS — L570 Actinic keratosis: Secondary | ICD-10-CM | POA: Diagnosis not present

## 2015-03-05 DIAGNOSIS — I2510 Atherosclerotic heart disease of native coronary artery without angina pectoris: Secondary | ICD-10-CM | POA: Diagnosis not present

## 2015-03-05 DIAGNOSIS — I10 Essential (primary) hypertension: Secondary | ICD-10-CM | POA: Diagnosis not present

## 2015-03-05 DIAGNOSIS — Z950 Presence of cardiac pacemaker: Secondary | ICD-10-CM | POA: Diagnosis not present

## 2015-03-05 DIAGNOSIS — C44629 Squamous cell carcinoma of skin of left upper limb, including shoulder: Secondary | ICD-10-CM | POA: Diagnosis not present

## 2015-03-05 DIAGNOSIS — E785 Hyperlipidemia, unspecified: Secondary | ICD-10-CM | POA: Diagnosis not present

## 2015-03-26 DIAGNOSIS — I10 Essential (primary) hypertension: Secondary | ICD-10-CM | POA: Diagnosis not present

## 2015-03-26 DIAGNOSIS — I2510 Atherosclerotic heart disease of native coronary artery without angina pectoris: Secondary | ICD-10-CM | POA: Diagnosis not present

## 2015-03-28 DIAGNOSIS — D500 Iron deficiency anemia secondary to blood loss (chronic): Secondary | ICD-10-CM | POA: Diagnosis not present

## 2015-04-09 DIAGNOSIS — H353232 Exudative age-related macular degeneration, bilateral, with inactive choroidal neovascularization: Secondary | ICD-10-CM | POA: Diagnosis not present

## 2015-04-09 DIAGNOSIS — D500 Iron deficiency anemia secondary to blood loss (chronic): Secondary | ICD-10-CM | POA: Diagnosis not present

## 2015-04-16 DIAGNOSIS — K5521 Angiodysplasia of colon with hemorrhage: Secondary | ICD-10-CM | POA: Diagnosis not present

## 2015-04-16 DIAGNOSIS — K5900 Constipation, unspecified: Secondary | ICD-10-CM | POA: Diagnosis not present

## 2015-04-16 DIAGNOSIS — K2971 Gastritis, unspecified, with bleeding: Secondary | ICD-10-CM | POA: Diagnosis not present

## 2015-04-16 DIAGNOSIS — D500 Iron deficiency anemia secondary to blood loss (chronic): Secondary | ICD-10-CM | POA: Diagnosis not present

## 2015-04-23 DIAGNOSIS — C44629 Squamous cell carcinoma of skin of left upper limb, including shoulder: Secondary | ICD-10-CM | POA: Diagnosis not present

## 2015-04-30 DIAGNOSIS — H31013 Macula scars of posterior pole (postinflammatory) (post-traumatic), bilateral: Secondary | ICD-10-CM | POA: Diagnosis not present

## 2015-04-30 DIAGNOSIS — H3122 Choroidal dystrophy (central areolar) (generalized) (peripapillary): Secondary | ICD-10-CM | POA: Diagnosis not present

## 2015-04-30 DIAGNOSIS — H353232 Exudative age-related macular degeneration, bilateral, with inactive choroidal neovascularization: Secondary | ICD-10-CM | POA: Diagnosis not present

## 2015-05-14 DIAGNOSIS — D509 Iron deficiency anemia, unspecified: Secondary | ICD-10-CM | POA: Diagnosis not present

## 2015-05-14 DIAGNOSIS — E1151 Type 2 diabetes mellitus with diabetic peripheral angiopathy without gangrene: Secondary | ICD-10-CM | POA: Diagnosis not present

## 2015-05-14 DIAGNOSIS — B351 Tinea unguium: Secondary | ICD-10-CM | POA: Diagnosis not present

## 2015-05-14 DIAGNOSIS — C61 Malignant neoplasm of prostate: Secondary | ICD-10-CM | POA: Diagnosis not present

## 2015-05-21 DIAGNOSIS — D509 Iron deficiency anemia, unspecified: Secondary | ICD-10-CM | POA: Diagnosis not present

## 2015-05-21 DIAGNOSIS — C61 Malignant neoplasm of prostate: Secondary | ICD-10-CM | POA: Diagnosis not present

## 2015-05-27 DIAGNOSIS — Z950 Presence of cardiac pacemaker: Secondary | ICD-10-CM | POA: Diagnosis not present

## 2015-05-28 DIAGNOSIS — Z79899 Other long term (current) drug therapy: Secondary | ICD-10-CM | POA: Diagnosis not present

## 2015-05-28 DIAGNOSIS — E1165 Type 2 diabetes mellitus with hyperglycemia: Secondary | ICD-10-CM | POA: Diagnosis not present

## 2015-05-28 DIAGNOSIS — I10 Essential (primary) hypertension: Secondary | ICD-10-CM | POA: Diagnosis not present

## 2015-05-28 DIAGNOSIS — E782 Mixed hyperlipidemia: Secondary | ICD-10-CM | POA: Diagnosis not present

## 2015-05-28 DIAGNOSIS — E559 Vitamin D deficiency, unspecified: Secondary | ICD-10-CM | POA: Diagnosis not present

## 2015-05-28 DIAGNOSIS — Z0000 Encounter for general adult medical examination without abnormal findings: Secondary | ICD-10-CM | POA: Diagnosis not present

## 2015-05-28 DIAGNOSIS — R5383 Other fatigue: Secondary | ICD-10-CM | POA: Diagnosis not present

## 2015-06-11 DIAGNOSIS — I10 Essential (primary) hypertension: Secondary | ICD-10-CM | POA: Diagnosis not present

## 2015-06-11 DIAGNOSIS — Z6831 Body mass index (BMI) 31.0-31.9, adult: Secondary | ICD-10-CM | POA: Diagnosis not present

## 2015-06-11 DIAGNOSIS — I2510 Atherosclerotic heart disease of native coronary artery without angina pectoris: Secondary | ICD-10-CM | POA: Diagnosis not present

## 2015-06-11 DIAGNOSIS — I739 Peripheral vascular disease, unspecified: Secondary | ICD-10-CM | POA: Diagnosis not present

## 2015-06-11 DIAGNOSIS — E785 Hyperlipidemia, unspecified: Secondary | ICD-10-CM | POA: Diagnosis not present

## 2015-06-11 DIAGNOSIS — Z950 Presence of cardiac pacemaker: Secondary | ICD-10-CM | POA: Diagnosis not present

## 2015-06-12 DIAGNOSIS — I739 Peripheral vascular disease, unspecified: Secondary | ICD-10-CM | POA: Diagnosis not present

## 2015-06-23 DIAGNOSIS — S80811A Abrasion, right lower leg, initial encounter: Secondary | ICD-10-CM | POA: Diagnosis not present

## 2015-06-25 DIAGNOSIS — L97911 Non-pressure chronic ulcer of unspecified part of right lower leg limited to breakdown of skin: Secondary | ICD-10-CM | POA: Diagnosis not present

## 2015-06-25 DIAGNOSIS — C44629 Squamous cell carcinoma of skin of left upper limb, including shoulder: Secondary | ICD-10-CM | POA: Diagnosis not present

## 2015-06-25 DIAGNOSIS — R233 Spontaneous ecchymoses: Secondary | ICD-10-CM | POA: Diagnosis not present

## 2015-07-02 DIAGNOSIS — H26493 Other secondary cataract, bilateral: Secondary | ICD-10-CM | POA: Diagnosis not present

## 2015-07-02 DIAGNOSIS — H353232 Exudative age-related macular degeneration, bilateral, with inactive choroidal neovascularization: Secondary | ICD-10-CM | POA: Diagnosis not present

## 2015-07-04 DIAGNOSIS — D500 Iron deficiency anemia secondary to blood loss (chronic): Secondary | ICD-10-CM | POA: Diagnosis not present

## 2015-07-09 DIAGNOSIS — D500 Iron deficiency anemia secondary to blood loss (chronic): Secondary | ICD-10-CM | POA: Diagnosis not present

## 2015-07-09 DIAGNOSIS — M170 Bilateral primary osteoarthritis of knee: Secondary | ICD-10-CM | POA: Diagnosis not present

## 2015-07-23 DIAGNOSIS — M25562 Pain in left knee: Secondary | ICD-10-CM | POA: Diagnosis not present

## 2015-07-23 DIAGNOSIS — M25661 Stiffness of right knee, not elsewhere classified: Secondary | ICD-10-CM | POA: Diagnosis not present

## 2015-07-23 DIAGNOSIS — M25662 Stiffness of left knee, not elsewhere classified: Secondary | ICD-10-CM | POA: Diagnosis not present

## 2015-07-23 DIAGNOSIS — M25561 Pain in right knee: Secondary | ICD-10-CM | POA: Diagnosis not present

## 2015-07-26 DIAGNOSIS — M25561 Pain in right knee: Secondary | ICD-10-CM | POA: Diagnosis not present

## 2015-07-26 DIAGNOSIS — M25562 Pain in left knee: Secondary | ICD-10-CM | POA: Diagnosis not present

## 2015-07-26 DIAGNOSIS — M25661 Stiffness of right knee, not elsewhere classified: Secondary | ICD-10-CM | POA: Diagnosis not present

## 2015-07-26 DIAGNOSIS — M25662 Stiffness of left knee, not elsewhere classified: Secondary | ICD-10-CM | POA: Diagnosis not present

## 2015-08-06 DIAGNOSIS — M25661 Stiffness of right knee, not elsewhere classified: Secondary | ICD-10-CM | POA: Diagnosis not present

## 2015-08-06 DIAGNOSIS — M25561 Pain in right knee: Secondary | ICD-10-CM | POA: Diagnosis not present

## 2015-08-06 DIAGNOSIS — M25562 Pain in left knee: Secondary | ICD-10-CM | POA: Diagnosis not present

## 2015-08-06 DIAGNOSIS — M25662 Stiffness of left knee, not elsewhere classified: Secondary | ICD-10-CM | POA: Diagnosis not present

## 2015-08-06 DIAGNOSIS — D500 Iron deficiency anemia secondary to blood loss (chronic): Secondary | ICD-10-CM | POA: Diagnosis not present

## 2015-08-06 DIAGNOSIS — K5521 Angiodysplasia of colon with hemorrhage: Secondary | ICD-10-CM | POA: Diagnosis not present

## 2015-08-06 DIAGNOSIS — K2971 Gastritis, unspecified, with bleeding: Secondary | ICD-10-CM | POA: Diagnosis not present

## 2015-08-09 DIAGNOSIS — M25562 Pain in left knee: Secondary | ICD-10-CM | POA: Diagnosis not present

## 2015-08-09 DIAGNOSIS — M25661 Stiffness of right knee, not elsewhere classified: Secondary | ICD-10-CM | POA: Diagnosis not present

## 2015-08-09 DIAGNOSIS — M25561 Pain in right knee: Secondary | ICD-10-CM | POA: Diagnosis not present

## 2015-08-09 DIAGNOSIS — M25662 Stiffness of left knee, not elsewhere classified: Secondary | ICD-10-CM | POA: Diagnosis not present

## 2015-08-13 DIAGNOSIS — M25561 Pain in right knee: Secondary | ICD-10-CM | POA: Diagnosis not present

## 2015-08-13 DIAGNOSIS — M25662 Stiffness of left knee, not elsewhere classified: Secondary | ICD-10-CM | POA: Diagnosis not present

## 2015-08-13 DIAGNOSIS — M25562 Pain in left knee: Secondary | ICD-10-CM | POA: Diagnosis not present

## 2015-08-13 DIAGNOSIS — M25661 Stiffness of right knee, not elsewhere classified: Secondary | ICD-10-CM | POA: Diagnosis not present

## 2015-08-20 DIAGNOSIS — C61 Malignant neoplasm of prostate: Secondary | ICD-10-CM

## 2015-08-23 DIAGNOSIS — I10 Essential (primary) hypertension: Secondary | ICD-10-CM | POA: Diagnosis not present

## 2015-08-23 DIAGNOSIS — Z9181 History of falling: Secondary | ICD-10-CM | POA: Diagnosis not present

## 2015-08-23 DIAGNOSIS — E1143 Type 2 diabetes mellitus with diabetic autonomic (poly)neuropathy: Secondary | ICD-10-CM | POA: Diagnosis not present

## 2015-08-23 DIAGNOSIS — N181 Chronic kidney disease, stage 1: Secondary | ICD-10-CM | POA: Diagnosis not present

## 2015-08-23 DIAGNOSIS — Z1389 Encounter for screening for other disorder: Secondary | ICD-10-CM | POA: Diagnosis not present

## 2015-08-27 DIAGNOSIS — C44622 Squamous cell carcinoma of skin of right upper limb, including shoulder: Secondary | ICD-10-CM | POA: Diagnosis not present

## 2015-08-27 DIAGNOSIS — C44629 Squamous cell carcinoma of skin of left upper limb, including shoulder: Secondary | ICD-10-CM | POA: Diagnosis not present

## 2015-09-03 DIAGNOSIS — E1151 Type 2 diabetes mellitus with diabetic peripheral angiopathy without gangrene: Secondary | ICD-10-CM | POA: Diagnosis not present

## 2015-09-03 DIAGNOSIS — B351 Tinea unguium: Secondary | ICD-10-CM | POA: Diagnosis not present

## 2015-09-12 DIAGNOSIS — H353232 Exudative age-related macular degeneration, bilateral, with inactive choroidal neovascularization: Secondary | ICD-10-CM | POA: Diagnosis not present

## 2015-09-17 DIAGNOSIS — Z950 Presence of cardiac pacemaker: Secondary | ICD-10-CM | POA: Diagnosis not present

## 2015-09-17 DIAGNOSIS — I2510 Atherosclerotic heart disease of native coronary artery without angina pectoris: Secondary | ICD-10-CM | POA: Diagnosis not present

## 2015-09-17 DIAGNOSIS — Z6830 Body mass index (BMI) 30.0-30.9, adult: Secondary | ICD-10-CM | POA: Diagnosis not present

## 2015-09-17 DIAGNOSIS — I739 Peripheral vascular disease, unspecified: Secondary | ICD-10-CM | POA: Diagnosis not present

## 2015-09-17 DIAGNOSIS — E785 Hyperlipidemia, unspecified: Secondary | ICD-10-CM | POA: Diagnosis not present

## 2015-09-17 DIAGNOSIS — I10 Essential (primary) hypertension: Secondary | ICD-10-CM | POA: Diagnosis not present

## 2015-09-24 DIAGNOSIS — C44629 Squamous cell carcinoma of skin of left upper limb, including shoulder: Secondary | ICD-10-CM | POA: Diagnosis not present

## 2015-10-05 DIAGNOSIS — R531 Weakness: Secondary | ICD-10-CM | POA: Diagnosis not present

## 2015-10-05 DIAGNOSIS — R42 Dizziness and giddiness: Secondary | ICD-10-CM | POA: Diagnosis not present

## 2015-10-10 DIAGNOSIS — I442 Atrioventricular block, complete: Secondary | ICD-10-CM | POA: Insufficient documentation

## 2015-10-10 DIAGNOSIS — Z45018 Encounter for adjustment and management of other part of cardiac pacemaker: Secondary | ICD-10-CM | POA: Diagnosis not present

## 2015-10-15 DIAGNOSIS — M170 Bilateral primary osteoarthritis of knee: Secondary | ICD-10-CM | POA: Diagnosis not present

## 2015-10-18 DIAGNOSIS — Z23 Encounter for immunization: Secondary | ICD-10-CM | POA: Diagnosis not present

## 2015-10-22 DIAGNOSIS — Q318 Other congenital malformations of larynx: Secondary | ICD-10-CM | POA: Diagnosis not present

## 2015-10-22 DIAGNOSIS — H6120 Impacted cerumen, unspecified ear: Secondary | ICD-10-CM | POA: Diagnosis not present

## 2015-10-24 DIAGNOSIS — I10 Essential (primary) hypertension: Secondary | ICD-10-CM | POA: Diagnosis not present

## 2015-10-24 DIAGNOSIS — Z6831 Body mass index (BMI) 31.0-31.9, adult: Secondary | ICD-10-CM | POA: Diagnosis not present

## 2015-10-24 DIAGNOSIS — N183 Chronic kidney disease, stage 3 (moderate): Secondary | ICD-10-CM | POA: Diagnosis not present

## 2015-11-05 DIAGNOSIS — H353232 Exudative age-related macular degeneration, bilateral, with inactive choroidal neovascularization: Secondary | ICD-10-CM | POA: Diagnosis not present

## 2015-11-05 DIAGNOSIS — E113393 Type 2 diabetes mellitus with moderate nonproliferative diabetic retinopathy without macular edema, bilateral: Secondary | ICD-10-CM | POA: Diagnosis not present

## 2015-11-05 DIAGNOSIS — H26493 Other secondary cataract, bilateral: Secondary | ICD-10-CM | POA: Diagnosis not present

## 2015-11-05 DIAGNOSIS — Z1389 Encounter for screening for other disorder: Secondary | ICD-10-CM | POA: Diagnosis not present

## 2015-11-05 DIAGNOSIS — Z9181 History of falling: Secondary | ICD-10-CM | POA: Diagnosis not present

## 2015-11-05 DIAGNOSIS — R3129 Other microscopic hematuria: Secondary | ICD-10-CM | POA: Diagnosis not present

## 2015-11-05 DIAGNOSIS — E1165 Type 2 diabetes mellitus with hyperglycemia: Secondary | ICD-10-CM | POA: Diagnosis not present

## 2015-11-06 DIAGNOSIS — H353232 Exudative age-related macular degeneration, bilateral, with inactive choroidal neovascularization: Secondary | ICD-10-CM | POA: Diagnosis not present

## 2015-11-06 DIAGNOSIS — Z961 Presence of intraocular lens: Secondary | ICD-10-CM | POA: Diagnosis not present

## 2015-11-06 DIAGNOSIS — H1859 Other hereditary corneal dystrophies: Secondary | ICD-10-CM | POA: Diagnosis not present

## 2015-11-06 DIAGNOSIS — H26491 Other secondary cataract, right eye: Secondary | ICD-10-CM | POA: Diagnosis not present

## 2015-11-12 DIAGNOSIS — N189 Chronic kidney disease, unspecified: Secondary | ICD-10-CM | POA: Diagnosis not present

## 2015-11-12 DIAGNOSIS — L570 Actinic keratosis: Secondary | ICD-10-CM | POA: Diagnosis not present

## 2015-11-19 DIAGNOSIS — N189 Chronic kidney disease, unspecified: Secondary | ICD-10-CM | POA: Diagnosis not present

## 2015-11-19 DIAGNOSIS — D509 Iron deficiency anemia, unspecified: Secondary | ICD-10-CM | POA: Diagnosis not present

## 2015-11-19 DIAGNOSIS — C61 Malignant neoplasm of prostate: Secondary | ICD-10-CM | POA: Diagnosis not present

## 2015-12-05 DIAGNOSIS — J342 Deviated nasal septum: Secondary | ICD-10-CM | POA: Diagnosis not present

## 2015-12-05 DIAGNOSIS — R490 Dysphonia: Secondary | ICD-10-CM | POA: Diagnosis not present

## 2015-12-05 DIAGNOSIS — J383 Other diseases of vocal cords: Secondary | ICD-10-CM | POA: Diagnosis not present

## 2015-12-05 DIAGNOSIS — R05 Cough: Secondary | ICD-10-CM | POA: Diagnosis not present

## 2015-12-05 DIAGNOSIS — M950 Acquired deformity of nose: Secondary | ICD-10-CM | POA: Diagnosis not present

## 2015-12-05 DIAGNOSIS — Z8709 Personal history of other diseases of the respiratory system: Secondary | ICD-10-CM | POA: Diagnosis not present

## 2015-12-17 DIAGNOSIS — I2510 Atherosclerotic heart disease of native coronary artery without angina pectoris: Secondary | ICD-10-CM | POA: Diagnosis not present

## 2015-12-17 DIAGNOSIS — Z6830 Body mass index (BMI) 30.0-30.9, adult: Secondary | ICD-10-CM | POA: Diagnosis not present

## 2015-12-17 DIAGNOSIS — I10 Essential (primary) hypertension: Secondary | ICD-10-CM | POA: Diagnosis not present

## 2015-12-17 DIAGNOSIS — E083211 Diabetes mellitus due to underlying condition with mild nonproliferative diabetic retinopathy with macular edema, right eye: Secondary | ICD-10-CM | POA: Diagnosis not present

## 2015-12-17 DIAGNOSIS — I739 Peripheral vascular disease, unspecified: Secondary | ICD-10-CM | POA: Diagnosis not present

## 2015-12-17 DIAGNOSIS — Z45018 Encounter for adjustment and management of other part of cardiac pacemaker: Secondary | ICD-10-CM | POA: Diagnosis not present

## 2015-12-17 DIAGNOSIS — B351 Tinea unguium: Secondary | ICD-10-CM | POA: Diagnosis not present

## 2015-12-17 DIAGNOSIS — E1151 Type 2 diabetes mellitus with diabetic peripheral angiopathy without gangrene: Secondary | ICD-10-CM | POA: Diagnosis not present

## 2015-12-17 DIAGNOSIS — E785 Hyperlipidemia, unspecified: Secondary | ICD-10-CM | POA: Diagnosis not present

## 2015-12-17 DIAGNOSIS — Z794 Long term (current) use of insulin: Secondary | ICD-10-CM | POA: Diagnosis not present

## 2015-12-17 DIAGNOSIS — I442 Atrioventricular block, complete: Secondary | ICD-10-CM | POA: Diagnosis not present

## 2015-12-28 DIAGNOSIS — H66009 Acute suppurative otitis media without spontaneous rupture of ear drum, unspecified ear: Secondary | ICD-10-CM | POA: Diagnosis not present

## 2015-12-28 DIAGNOSIS — M26609 Unspecified temporomandibular joint disorder, unspecified side: Secondary | ICD-10-CM | POA: Diagnosis not present

## 2016-01-09 DIAGNOSIS — Z45018 Encounter for adjustment and management of other part of cardiac pacemaker: Secondary | ICD-10-CM | POA: Diagnosis not present

## 2016-01-09 DIAGNOSIS — Z950 Presence of cardiac pacemaker: Secondary | ICD-10-CM | POA: Diagnosis not present

## 2016-01-14 DIAGNOSIS — L570 Actinic keratosis: Secondary | ICD-10-CM | POA: Diagnosis not present

## 2016-01-14 DIAGNOSIS — C44629 Squamous cell carcinoma of skin of left upper limb, including shoulder: Secondary | ICD-10-CM | POA: Diagnosis not present

## 2016-01-17 DIAGNOSIS — N183 Chronic kidney disease, stage 3 (moderate): Secondary | ICD-10-CM | POA: Diagnosis not present

## 2016-01-17 DIAGNOSIS — Z6831 Body mass index (BMI) 31.0-31.9, adult: Secondary | ICD-10-CM | POA: Diagnosis not present

## 2016-01-17 DIAGNOSIS — I10 Essential (primary) hypertension: Secondary | ICD-10-CM | POA: Diagnosis not present

## 2016-01-23 DIAGNOSIS — L728 Other follicular cysts of the skin and subcutaneous tissue: Secondary | ICD-10-CM | POA: Diagnosis not present

## 2016-01-23 DIAGNOSIS — R233 Spontaneous ecchymoses: Secondary | ICD-10-CM | POA: Diagnosis not present

## 2016-01-28 DIAGNOSIS — D500 Iron deficiency anemia secondary to blood loss (chronic): Secondary | ICD-10-CM | POA: Diagnosis not present

## 2016-02-03 DIAGNOSIS — J3489 Other specified disorders of nose and nasal sinuses: Secondary | ICD-10-CM | POA: Diagnosis not present

## 2016-02-03 DIAGNOSIS — J342 Deviated nasal septum: Secondary | ICD-10-CM | POA: Diagnosis not present

## 2016-02-03 DIAGNOSIS — R0981 Nasal congestion: Secondary | ICD-10-CM | POA: Diagnosis not present

## 2016-02-04 DIAGNOSIS — K5521 Angiodysplasia of colon with hemorrhage: Secondary | ICD-10-CM | POA: Diagnosis not present

## 2016-02-04 DIAGNOSIS — K2971 Gastritis, unspecified, with bleeding: Secondary | ICD-10-CM | POA: Diagnosis not present

## 2016-02-04 DIAGNOSIS — R195 Other fecal abnormalities: Secondary | ICD-10-CM | POA: Diagnosis not present

## 2016-02-04 DIAGNOSIS — K5900 Constipation, unspecified: Secondary | ICD-10-CM | POA: Diagnosis not present

## 2016-02-04 DIAGNOSIS — D500 Iron deficiency anemia secondary to blood loss (chronic): Secondary | ICD-10-CM | POA: Diagnosis not present

## 2016-02-06 DIAGNOSIS — D500 Iron deficiency anemia secondary to blood loss (chronic): Secondary | ICD-10-CM | POA: Diagnosis not present

## 2016-02-06 DIAGNOSIS — E1165 Type 2 diabetes mellitus with hyperglycemia: Secondary | ICD-10-CM | POA: Diagnosis not present

## 2016-02-06 DIAGNOSIS — Z1389 Encounter for screening for other disorder: Secondary | ICD-10-CM | POA: Diagnosis not present

## 2016-02-06 DIAGNOSIS — B351 Tinea unguium: Secondary | ICD-10-CM | POA: Diagnosis not present

## 2016-02-06 DIAGNOSIS — I10 Essential (primary) hypertension: Secondary | ICD-10-CM | POA: Diagnosis not present

## 2016-02-06 DIAGNOSIS — R3129 Other microscopic hematuria: Secondary | ICD-10-CM | POA: Diagnosis not present

## 2016-02-06 DIAGNOSIS — E1151 Type 2 diabetes mellitus with diabetic peripheral angiopathy without gangrene: Secondary | ICD-10-CM | POA: Diagnosis not present

## 2016-02-06 DIAGNOSIS — I951 Orthostatic hypotension: Secondary | ICD-10-CM | POA: Diagnosis not present

## 2016-02-06 DIAGNOSIS — M7741 Metatarsalgia, right foot: Secondary | ICD-10-CM | POA: Diagnosis not present

## 2016-02-06 DIAGNOSIS — R29898 Other symptoms and signs involving the musculoskeletal system: Secondary | ICD-10-CM | POA: Diagnosis not present

## 2016-02-18 DIAGNOSIS — M8580 Other specified disorders of bone density and structure, unspecified site: Secondary | ICD-10-CM | POA: Diagnosis not present

## 2016-02-18 DIAGNOSIS — Z13820 Encounter for screening for osteoporosis: Secondary | ICD-10-CM | POA: Diagnosis not present

## 2016-02-18 DIAGNOSIS — M859 Disorder of bone density and structure, unspecified: Secondary | ICD-10-CM | POA: Diagnosis not present

## 2016-02-20 DIAGNOSIS — I714 Abdominal aortic aneurysm, without rupture: Secondary | ICD-10-CM | POA: Diagnosis not present

## 2016-03-04 DIAGNOSIS — I10 Essential (primary) hypertension: Secondary | ICD-10-CM | POA: Diagnosis not present

## 2016-03-04 DIAGNOSIS — E782 Mixed hyperlipidemia: Secondary | ICD-10-CM | POA: Diagnosis not present

## 2016-03-04 DIAGNOSIS — J329 Chronic sinusitis, unspecified: Secondary | ICD-10-CM | POA: Diagnosis not present

## 2016-03-10 DIAGNOSIS — E782 Mixed hyperlipidemia: Secondary | ICD-10-CM | POA: Diagnosis not present

## 2016-03-10 DIAGNOSIS — D519 Vitamin B12 deficiency anemia, unspecified: Secondary | ICD-10-CM | POA: Diagnosis not present

## 2016-03-10 DIAGNOSIS — R5381 Other malaise: Secondary | ICD-10-CM | POA: Diagnosis not present

## 2016-03-10 DIAGNOSIS — L570 Actinic keratosis: Secondary | ICD-10-CM | POA: Diagnosis not present

## 2016-03-10 DIAGNOSIS — Z79899 Other long term (current) drug therapy: Secondary | ICD-10-CM | POA: Diagnosis not present

## 2016-03-10 DIAGNOSIS — R5383 Other fatigue: Secondary | ICD-10-CM | POA: Diagnosis not present

## 2016-03-10 DIAGNOSIS — J441 Chronic obstructive pulmonary disease with (acute) exacerbation: Secondary | ICD-10-CM | POA: Diagnosis not present

## 2016-03-10 DIAGNOSIS — Z1389 Encounter for screening for other disorder: Secondary | ICD-10-CM | POA: Diagnosis not present

## 2016-03-17 DIAGNOSIS — H353232 Exudative age-related macular degeneration, bilateral, with inactive choroidal neovascularization: Secondary | ICD-10-CM | POA: Diagnosis not present

## 2016-03-17 DIAGNOSIS — E113393 Type 2 diabetes mellitus with moderate nonproliferative diabetic retinopathy without macular edema, bilateral: Secondary | ICD-10-CM | POA: Diagnosis not present

## 2016-03-24 DIAGNOSIS — J342 Deviated nasal septum: Secondary | ICD-10-CM | POA: Diagnosis not present

## 2016-03-24 DIAGNOSIS — Z85828 Personal history of other malignant neoplasm of skin: Secondary | ICD-10-CM | POA: Diagnosis not present

## 2016-03-24 DIAGNOSIS — J329 Chronic sinusitis, unspecified: Secondary | ICD-10-CM | POA: Diagnosis not present

## 2016-03-24 DIAGNOSIS — M950 Acquired deformity of nose: Secondary | ICD-10-CM | POA: Diagnosis not present

## 2016-03-24 DIAGNOSIS — Z6831 Body mass index (BMI) 31.0-31.9, adult: Secondary | ICD-10-CM | POA: Diagnosis not present

## 2016-03-31 DIAGNOSIS — E1151 Type 2 diabetes mellitus with diabetic peripheral angiopathy without gangrene: Secondary | ICD-10-CM | POA: Diagnosis not present

## 2016-03-31 DIAGNOSIS — I739 Peripheral vascular disease, unspecified: Secondary | ICD-10-CM | POA: Diagnosis not present

## 2016-03-31 DIAGNOSIS — B351 Tinea unguium: Secondary | ICD-10-CM | POA: Diagnosis not present

## 2016-04-07 DIAGNOSIS — Z6832 Body mass index (BMI) 32.0-32.9, adult: Secondary | ICD-10-CM | POA: Diagnosis not present

## 2016-04-07 DIAGNOSIS — K219 Gastro-esophageal reflux disease without esophagitis: Secondary | ICD-10-CM | POA: Diagnosis not present

## 2016-04-07 DIAGNOSIS — J343 Hypertrophy of nasal turbinates: Secondary | ICD-10-CM | POA: Diagnosis not present

## 2016-04-07 DIAGNOSIS — J310 Chronic rhinitis: Secondary | ICD-10-CM | POA: Diagnosis not present

## 2016-04-07 DIAGNOSIS — M950 Acquired deformity of nose: Secondary | ICD-10-CM | POA: Diagnosis not present

## 2016-04-07 DIAGNOSIS — Z85828 Personal history of other malignant neoplasm of skin: Secondary | ICD-10-CM | POA: Diagnosis not present

## 2016-04-07 DIAGNOSIS — J342 Deviated nasal septum: Secondary | ICD-10-CM | POA: Diagnosis not present

## 2016-04-07 DIAGNOSIS — J329 Chronic sinusitis, unspecified: Secondary | ICD-10-CM | POA: Diagnosis not present

## 2016-04-08 DIAGNOSIS — C61 Malignant neoplasm of prostate: Secondary | ICD-10-CM | POA: Diagnosis not present

## 2016-04-08 DIAGNOSIS — R3121 Asymptomatic microscopic hematuria: Secondary | ICD-10-CM | POA: Diagnosis not present

## 2016-04-08 DIAGNOSIS — N281 Cyst of kidney, acquired: Secondary | ICD-10-CM | POA: Diagnosis not present

## 2016-04-20 DIAGNOSIS — Z950 Presence of cardiac pacemaker: Secondary | ICD-10-CM | POA: Diagnosis not present

## 2016-05-07 DIAGNOSIS — L570 Actinic keratosis: Secondary | ICD-10-CM | POA: Diagnosis not present

## 2016-05-07 DIAGNOSIS — C44712 Basal cell carcinoma of skin of right lower limb, including hip: Secondary | ICD-10-CM | POA: Diagnosis not present

## 2016-05-12 DIAGNOSIS — D509 Iron deficiency anemia, unspecified: Secondary | ICD-10-CM | POA: Diagnosis not present

## 2016-05-12 DIAGNOSIS — C61 Malignant neoplasm of prostate: Secondary | ICD-10-CM | POA: Diagnosis not present

## 2016-05-13 DIAGNOSIS — Z6831 Body mass index (BMI) 31.0-31.9, adult: Secondary | ICD-10-CM | POA: Diagnosis not present

## 2016-05-13 DIAGNOSIS — J343 Hypertrophy of nasal turbinates: Secondary | ICD-10-CM | POA: Diagnosis not present

## 2016-05-13 DIAGNOSIS — J342 Deviated nasal septum: Secondary | ICD-10-CM | POA: Diagnosis not present

## 2016-05-13 DIAGNOSIS — J300 Vasomotor rhinitis: Secondary | ICD-10-CM | POA: Diagnosis not present

## 2016-05-13 DIAGNOSIS — M950 Acquired deformity of nose: Secondary | ICD-10-CM | POA: Diagnosis not present

## 2016-05-13 DIAGNOSIS — K219 Gastro-esophageal reflux disease without esophagitis: Secondary | ICD-10-CM | POA: Diagnosis not present

## 2016-05-19 DIAGNOSIS — N189 Chronic kidney disease, unspecified: Secondary | ICD-10-CM | POA: Diagnosis not present

## 2016-05-19 DIAGNOSIS — C44629 Squamous cell carcinoma of skin of left upper limb, including shoulder: Secondary | ICD-10-CM | POA: Diagnosis not present

## 2016-05-19 DIAGNOSIS — D509 Iron deficiency anemia, unspecified: Secondary | ICD-10-CM | POA: Diagnosis not present

## 2016-05-19 DIAGNOSIS — C61 Malignant neoplasm of prostate: Secondary | ICD-10-CM | POA: Diagnosis not present

## 2016-05-28 DIAGNOSIS — C44629 Squamous cell carcinoma of skin of left upper limb, including shoulder: Secondary | ICD-10-CM | POA: Diagnosis not present

## 2016-06-09 DIAGNOSIS — H3122 Choroidal dystrophy (central areolar) (generalized) (peripapillary): Secondary | ICD-10-CM | POA: Diagnosis not present

## 2016-06-09 DIAGNOSIS — I10 Essential (primary) hypertension: Secondary | ICD-10-CM | POA: Diagnosis not present

## 2016-06-09 DIAGNOSIS — H31013 Macula scars of posterior pole (postinflammatory) (post-traumatic), bilateral: Secondary | ICD-10-CM | POA: Diagnosis not present

## 2016-06-09 DIAGNOSIS — H353232 Exudative age-related macular degeneration, bilateral, with inactive choroidal neovascularization: Secondary | ICD-10-CM | POA: Diagnosis not present

## 2016-06-16 DIAGNOSIS — I2510 Atherosclerotic heart disease of native coronary artery without angina pectoris: Secondary | ICD-10-CM | POA: Diagnosis not present

## 2016-06-16 DIAGNOSIS — I442 Atrioventricular block, complete: Secondary | ICD-10-CM | POA: Diagnosis not present

## 2016-06-16 DIAGNOSIS — E785 Hyperlipidemia, unspecified: Secondary | ICD-10-CM | POA: Diagnosis not present

## 2016-06-16 DIAGNOSIS — I739 Peripheral vascular disease, unspecified: Secondary | ICD-10-CM | POA: Diagnosis not present

## 2016-06-16 DIAGNOSIS — Z6831 Body mass index (BMI) 31.0-31.9, adult: Secondary | ICD-10-CM | POA: Diagnosis not present

## 2016-06-16 DIAGNOSIS — I10 Essential (primary) hypertension: Secondary | ICD-10-CM | POA: Diagnosis not present

## 2016-06-16 DIAGNOSIS — Z45018 Encounter for adjustment and management of other part of cardiac pacemaker: Secondary | ICD-10-CM | POA: Diagnosis not present

## 2016-06-30 DIAGNOSIS — E1143 Type 2 diabetes mellitus with diabetic autonomic (poly)neuropathy: Secondary | ICD-10-CM | POA: Diagnosis not present

## 2016-06-30 DIAGNOSIS — Z23 Encounter for immunization: Secondary | ICD-10-CM | POA: Diagnosis not present

## 2016-06-30 DIAGNOSIS — Z0000 Encounter for general adult medical examination without abnormal findings: Secondary | ICD-10-CM | POA: Diagnosis not present

## 2016-06-30 DIAGNOSIS — Z79899 Other long term (current) drug therapy: Secondary | ICD-10-CM | POA: Diagnosis not present

## 2016-06-30 DIAGNOSIS — Z6830 Body mass index (BMI) 30.0-30.9, adult: Secondary | ICD-10-CM | POA: Diagnosis not present

## 2016-06-30 DIAGNOSIS — R042 Hemoptysis: Secondary | ICD-10-CM | POA: Diagnosis not present

## 2016-06-30 DIAGNOSIS — I10 Essential (primary) hypertension: Secondary | ICD-10-CM | POA: Diagnosis not present

## 2016-06-30 DIAGNOSIS — Z1389 Encounter for screening for other disorder: Secondary | ICD-10-CM | POA: Diagnosis not present

## 2016-07-09 DIAGNOSIS — J439 Emphysema, unspecified: Secondary | ICD-10-CM | POA: Diagnosis not present

## 2016-07-09 DIAGNOSIS — R918 Other nonspecific abnormal finding of lung field: Secondary | ICD-10-CM | POA: Diagnosis not present

## 2016-07-09 DIAGNOSIS — N200 Calculus of kidney: Secondary | ICD-10-CM | POA: Diagnosis not present

## 2016-07-09 DIAGNOSIS — I700 Atherosclerosis of aorta: Secondary | ICD-10-CM | POA: Diagnosis not present

## 2016-07-09 DIAGNOSIS — R042 Hemoptysis: Secondary | ICD-10-CM | POA: Diagnosis not present

## 2016-07-20 DIAGNOSIS — Z950 Presence of cardiac pacemaker: Secondary | ICD-10-CM | POA: Diagnosis not present

## 2016-07-21 DIAGNOSIS — M950 Acquired deformity of nose: Secondary | ICD-10-CM | POA: Diagnosis not present

## 2016-07-21 DIAGNOSIS — K219 Gastro-esophageal reflux disease without esophagitis: Secondary | ICD-10-CM | POA: Diagnosis not present

## 2016-07-21 DIAGNOSIS — H353232 Exudative age-related macular degeneration, bilateral, with inactive choroidal neovascularization: Secondary | ICD-10-CM | POA: Diagnosis not present

## 2016-07-21 DIAGNOSIS — J343 Hypertrophy of nasal turbinates: Secondary | ICD-10-CM | POA: Diagnosis not present

## 2016-07-21 DIAGNOSIS — J300 Vasomotor rhinitis: Secondary | ICD-10-CM | POA: Diagnosis not present

## 2016-07-21 DIAGNOSIS — Z6831 Body mass index (BMI) 31.0-31.9, adult: Secondary | ICD-10-CM | POA: Diagnosis not present

## 2016-07-21 DIAGNOSIS — J342 Deviated nasal septum: Secondary | ICD-10-CM | POA: Diagnosis not present

## 2016-07-23 DIAGNOSIS — I442 Atrioventricular block, complete: Secondary | ICD-10-CM | POA: Diagnosis not present

## 2016-07-23 DIAGNOSIS — Z45018 Encounter for adjustment and management of other part of cardiac pacemaker: Secondary | ICD-10-CM | POA: Diagnosis not present

## 2016-07-23 DIAGNOSIS — I6523 Occlusion and stenosis of bilateral carotid arteries: Secondary | ICD-10-CM | POA: Diagnosis not present

## 2016-07-23 DIAGNOSIS — E1151 Type 2 diabetes mellitus with diabetic peripheral angiopathy without gangrene: Secondary | ICD-10-CM | POA: Diagnosis not present

## 2016-07-23 DIAGNOSIS — E785 Hyperlipidemia, unspecified: Secondary | ICD-10-CM | POA: Diagnosis not present

## 2016-07-23 DIAGNOSIS — I739 Peripheral vascular disease, unspecified: Secondary | ICD-10-CM | POA: Diagnosis not present

## 2016-07-23 DIAGNOSIS — I10 Essential (primary) hypertension: Secondary | ICD-10-CM | POA: Diagnosis not present

## 2016-07-23 DIAGNOSIS — I517 Cardiomegaly: Secondary | ICD-10-CM | POA: Diagnosis not present

## 2016-07-23 DIAGNOSIS — I2510 Atherosclerotic heart disease of native coronary artery without angina pectoris: Secondary | ICD-10-CM | POA: Diagnosis not present

## 2016-07-23 DIAGNOSIS — R29898 Other symptoms and signs involving the musculoskeletal system: Secondary | ICD-10-CM | POA: Diagnosis not present

## 2016-07-23 DIAGNOSIS — B351 Tinea unguium: Secondary | ICD-10-CM | POA: Diagnosis not present

## 2016-08-03 ENCOUNTER — Telehealth: Payer: Self-pay | Admitting: Cardiology

## 2016-08-03 NOTE — Telephone Encounter (Signed)
Wants the results of his echo and vascular done at [facility] (states [facility] ordered it but shows [facility] as ordering physician)

## 2016-08-03 NOTE — Telephone Encounter (Signed)
Patient advised that we have the results and would have Dr. Agustin Cree review upon his return Wednesday and give him results. Patient verbalized understanding.

## 2016-08-05 ENCOUNTER — Telehealth: Payer: Self-pay

## 2016-08-05 NOTE — Telephone Encounter (Signed)
Pt advised of results. 

## 2016-08-05 NOTE — Telephone Encounter (Signed)
Pt has been advised of results.

## 2016-08-05 NOTE — Telephone Encounter (Signed)
-----   Message from Park Liter, MD sent at 08/05/2016  3:02 PM EDT ----- I reviewed his echo - EF 40 - 45%  - as before Carotids US showed up to 50% stenosis, continue present management

## 2016-08-11 DIAGNOSIS — R195 Other fecal abnormalities: Secondary | ICD-10-CM | POA: Diagnosis not present

## 2016-08-11 DIAGNOSIS — K5521 Angiodysplasia of colon with hemorrhage: Secondary | ICD-10-CM | POA: Diagnosis not present

## 2016-08-11 DIAGNOSIS — D500 Iron deficiency anemia secondary to blood loss (chronic): Secondary | ICD-10-CM | POA: Diagnosis not present

## 2016-08-11 DIAGNOSIS — Z1212 Encounter for screening for malignant neoplasm of rectum: Secondary | ICD-10-CM | POA: Diagnosis not present

## 2016-08-18 DIAGNOSIS — Z1389 Encounter for screening for other disorder: Secondary | ICD-10-CM | POA: Diagnosis not present

## 2016-08-18 DIAGNOSIS — M545 Low back pain: Secondary | ICD-10-CM | POA: Diagnosis not present

## 2016-08-18 DIAGNOSIS — Z9181 History of falling: Secondary | ICD-10-CM | POA: Diagnosis not present

## 2016-08-18 DIAGNOSIS — R3129 Other microscopic hematuria: Secondary | ICD-10-CM | POA: Diagnosis not present

## 2016-09-03 DIAGNOSIS — R293 Abnormal posture: Secondary | ICD-10-CM | POA: Diagnosis not present

## 2016-09-03 DIAGNOSIS — M6281 Muscle weakness (generalized): Secondary | ICD-10-CM | POA: Diagnosis not present

## 2016-09-03 DIAGNOSIS — M545 Low back pain: Secondary | ICD-10-CM | POA: Diagnosis not present

## 2016-09-08 DIAGNOSIS — M6281 Muscle weakness (generalized): Secondary | ICD-10-CM | POA: Diagnosis not present

## 2016-09-08 DIAGNOSIS — M545 Low back pain: Secondary | ICD-10-CM | POA: Diagnosis not present

## 2016-09-08 DIAGNOSIS — R293 Abnormal posture: Secondary | ICD-10-CM | POA: Diagnosis not present

## 2016-09-10 DIAGNOSIS — R293 Abnormal posture: Secondary | ICD-10-CM | POA: Diagnosis not present

## 2016-09-10 DIAGNOSIS — M6281 Muscle weakness (generalized): Secondary | ICD-10-CM | POA: Diagnosis not present

## 2016-09-10 DIAGNOSIS — M545 Low back pain: Secondary | ICD-10-CM | POA: Diagnosis not present

## 2016-09-15 DIAGNOSIS — J300 Vasomotor rhinitis: Secondary | ICD-10-CM | POA: Diagnosis not present

## 2016-09-15 DIAGNOSIS — J343 Hypertrophy of nasal turbinates: Secondary | ICD-10-CM | POA: Diagnosis not present

## 2016-09-15 DIAGNOSIS — K219 Gastro-esophageal reflux disease without esophagitis: Secondary | ICD-10-CM | POA: Diagnosis not present

## 2016-09-15 DIAGNOSIS — J342 Deviated nasal septum: Secondary | ICD-10-CM | POA: Diagnosis not present

## 2016-09-15 DIAGNOSIS — M950 Acquired deformity of nose: Secondary | ICD-10-CM | POA: Diagnosis not present

## 2016-09-15 DIAGNOSIS — B351 Tinea unguium: Secondary | ICD-10-CM | POA: Diagnosis not present

## 2016-09-15 DIAGNOSIS — L570 Actinic keratosis: Secondary | ICD-10-CM | POA: Diagnosis not present

## 2016-09-17 DIAGNOSIS — M6281 Muscle weakness (generalized): Secondary | ICD-10-CM | POA: Diagnosis not present

## 2016-09-17 DIAGNOSIS — R293 Abnormal posture: Secondary | ICD-10-CM | POA: Diagnosis not present

## 2016-09-17 DIAGNOSIS — M545 Low back pain: Secondary | ICD-10-CM | POA: Diagnosis not present

## 2016-09-22 DIAGNOSIS — M6281 Muscle weakness (generalized): Secondary | ICD-10-CM | POA: Diagnosis not present

## 2016-09-22 DIAGNOSIS — M545 Low back pain: Secondary | ICD-10-CM | POA: Diagnosis not present

## 2016-09-22 DIAGNOSIS — R293 Abnormal posture: Secondary | ICD-10-CM | POA: Diagnosis not present

## 2016-09-24 DIAGNOSIS — M545 Low back pain: Secondary | ICD-10-CM | POA: Diagnosis not present

## 2016-09-24 DIAGNOSIS — M6281 Muscle weakness (generalized): Secondary | ICD-10-CM | POA: Diagnosis not present

## 2016-09-24 DIAGNOSIS — R293 Abnormal posture: Secondary | ICD-10-CM | POA: Diagnosis not present

## 2016-09-29 DIAGNOSIS — M545 Low back pain: Secondary | ICD-10-CM | POA: Diagnosis not present

## 2016-09-29 DIAGNOSIS — M6281 Muscle weakness (generalized): Secondary | ICD-10-CM | POA: Diagnosis not present

## 2016-09-29 DIAGNOSIS — R293 Abnormal posture: Secondary | ICD-10-CM | POA: Diagnosis not present

## 2016-10-02 ENCOUNTER — Telehealth: Payer: Self-pay

## 2016-10-02 MED ORDER — CLOPIDOGREL BISULFATE 75 MG PO TABS: 75.0000 mg | ORAL_TABLET | Freq: Every day | ORAL | 6 refills | Status: DC

## 2016-10-02 NOTE — Telephone Encounter (Signed)
Refill request faxed from pharmacy for Clopidorel. Patient plans to schedule an appointment soon. Refills have been provided.

## 2016-10-06 DIAGNOSIS — M545 Low back pain: Secondary | ICD-10-CM | POA: Diagnosis not present

## 2016-10-06 DIAGNOSIS — R293 Abnormal posture: Secondary | ICD-10-CM | POA: Diagnosis not present

## 2016-10-06 DIAGNOSIS — M6281 Muscle weakness (generalized): Secondary | ICD-10-CM | POA: Diagnosis not present

## 2016-10-08 DIAGNOSIS — M6281 Muscle weakness (generalized): Secondary | ICD-10-CM | POA: Diagnosis not present

## 2016-10-08 DIAGNOSIS — M545 Low back pain: Secondary | ICD-10-CM | POA: Diagnosis not present

## 2016-10-08 DIAGNOSIS — R293 Abnormal posture: Secondary | ICD-10-CM | POA: Diagnosis not present

## 2016-10-09 ENCOUNTER — Telehealth: Payer: Self-pay

## 2016-10-09 MED ORDER — POTASSIUM CHLORIDE ER 10 MEQ PO CPCR: 10.0000 meq | ORAL_CAPSULE | Freq: Every day | ORAL | 6 refills | Status: DC

## 2016-10-09 NOTE — Telephone Encounter (Signed)
Refill request sent from pharmacy for Potassium. Refills have been sent

## 2016-10-20 DIAGNOSIS — R233 Spontaneous ecchymoses: Secondary | ICD-10-CM | POA: Diagnosis not present

## 2016-10-20 DIAGNOSIS — R293 Abnormal posture: Secondary | ICD-10-CM | POA: Diagnosis not present

## 2016-10-20 DIAGNOSIS — M545 Low back pain: Secondary | ICD-10-CM | POA: Diagnosis not present

## 2016-10-20 DIAGNOSIS — M6281 Muscle weakness (generalized): Secondary | ICD-10-CM | POA: Diagnosis not present

## 2016-10-21 DIAGNOSIS — M6281 Muscle weakness (generalized): Secondary | ICD-10-CM | POA: Diagnosis not present

## 2016-10-22 DIAGNOSIS — M545 Low back pain: Secondary | ICD-10-CM | POA: Diagnosis not present

## 2016-10-22 DIAGNOSIS — M6281 Muscle weakness (generalized): Secondary | ICD-10-CM | POA: Diagnosis not present

## 2016-10-22 DIAGNOSIS — R293 Abnormal posture: Secondary | ICD-10-CM | POA: Diagnosis not present

## 2016-10-27 DIAGNOSIS — R293 Abnormal posture: Secondary | ICD-10-CM | POA: Diagnosis not present

## 2016-10-27 DIAGNOSIS — M6281 Muscle weakness (generalized): Secondary | ICD-10-CM | POA: Diagnosis not present

## 2016-10-27 DIAGNOSIS — M545 Low back pain: Secondary | ICD-10-CM | POA: Diagnosis not present

## 2016-10-29 DIAGNOSIS — R3129 Other microscopic hematuria: Secondary | ICD-10-CM | POA: Diagnosis not present

## 2016-10-29 DIAGNOSIS — M6281 Muscle weakness (generalized): Secondary | ICD-10-CM | POA: Diagnosis not present

## 2016-10-29 DIAGNOSIS — Z6829 Body mass index (BMI) 29.0-29.9, adult: Secondary | ICD-10-CM | POA: Diagnosis not present

## 2016-10-29 DIAGNOSIS — E1143 Type 2 diabetes mellitus with diabetic autonomic (poly)neuropathy: Secondary | ICD-10-CM | POA: Diagnosis not present

## 2016-10-29 DIAGNOSIS — Z23 Encounter for immunization: Secondary | ICD-10-CM | POA: Diagnosis not present

## 2016-10-29 DIAGNOSIS — M545 Low back pain: Secondary | ICD-10-CM | POA: Diagnosis not present

## 2016-10-29 DIAGNOSIS — I10 Essential (primary) hypertension: Secondary | ICD-10-CM | POA: Diagnosis not present

## 2016-10-29 DIAGNOSIS — E782 Mixed hyperlipidemia: Secondary | ICD-10-CM | POA: Diagnosis not present

## 2016-10-29 DIAGNOSIS — R293 Abnormal posture: Secondary | ICD-10-CM | POA: Diagnosis not present

## 2016-10-30 DIAGNOSIS — M6281 Muscle weakness (generalized): Secondary | ICD-10-CM | POA: Diagnosis not present

## 2016-11-03 DIAGNOSIS — C61 Malignant neoplasm of prostate: Secondary | ICD-10-CM | POA: Diagnosis not present

## 2016-11-03 DIAGNOSIS — D509 Iron deficiency anemia, unspecified: Secondary | ICD-10-CM | POA: Diagnosis not present

## 2016-11-10 DIAGNOSIS — R293 Abnormal posture: Secondary | ICD-10-CM | POA: Diagnosis not present

## 2016-11-10 DIAGNOSIS — D509 Iron deficiency anemia, unspecified: Secondary | ICD-10-CM | POA: Diagnosis not present

## 2016-11-10 DIAGNOSIS — N189 Chronic kidney disease, unspecified: Secondary | ICD-10-CM | POA: Diagnosis not present

## 2016-11-10 DIAGNOSIS — D508 Other iron deficiency anemias: Secondary | ICD-10-CM | POA: Diagnosis not present

## 2016-11-10 DIAGNOSIS — M6281 Muscle weakness (generalized): Secondary | ICD-10-CM | POA: Diagnosis not present

## 2016-11-10 DIAGNOSIS — C61 Malignant neoplasm of prostate: Secondary | ICD-10-CM | POA: Diagnosis not present

## 2016-11-10 DIAGNOSIS — M545 Low back pain: Secondary | ICD-10-CM | POA: Diagnosis not present

## 2016-11-12 DIAGNOSIS — E1151 Type 2 diabetes mellitus with diabetic peripheral angiopathy without gangrene: Secondary | ICD-10-CM | POA: Diagnosis not present

## 2016-11-12 DIAGNOSIS — B351 Tinea unguium: Secondary | ICD-10-CM | POA: Diagnosis not present

## 2016-11-12 DIAGNOSIS — M545 Low back pain: Secondary | ICD-10-CM | POA: Diagnosis not present

## 2016-11-12 DIAGNOSIS — M6281 Muscle weakness (generalized): Secondary | ICD-10-CM | POA: Diagnosis not present

## 2016-11-12 DIAGNOSIS — R293 Abnormal posture: Secondary | ICD-10-CM | POA: Diagnosis not present

## 2016-11-12 DIAGNOSIS — R29898 Other symptoms and signs involving the musculoskeletal system: Secondary | ICD-10-CM | POA: Diagnosis not present

## 2016-11-13 DIAGNOSIS — H1033 Unspecified acute conjunctivitis, bilateral: Secondary | ICD-10-CM | POA: Diagnosis not present

## 2016-11-19 DIAGNOSIS — R293 Abnormal posture: Secondary | ICD-10-CM | POA: Diagnosis not present

## 2016-11-19 DIAGNOSIS — M6281 Muscle weakness (generalized): Secondary | ICD-10-CM | POA: Diagnosis not present

## 2016-11-19 DIAGNOSIS — M545 Low back pain: Secondary | ICD-10-CM | POA: Diagnosis not present

## 2016-11-24 DIAGNOSIS — M6281 Muscle weakness (generalized): Secondary | ICD-10-CM | POA: Diagnosis not present

## 2016-11-24 DIAGNOSIS — R293 Abnormal posture: Secondary | ICD-10-CM | POA: Diagnosis not present

## 2016-11-24 DIAGNOSIS — M545 Low back pain: Secondary | ICD-10-CM | POA: Diagnosis not present

## 2016-11-26 DIAGNOSIS — E113393 Type 2 diabetes mellitus with moderate nonproliferative diabetic retinopathy without macular edema, bilateral: Secondary | ICD-10-CM | POA: Diagnosis not present

## 2016-11-26 DIAGNOSIS — H353232 Exudative age-related macular degeneration, bilateral, with inactive choroidal neovascularization: Secondary | ICD-10-CM | POA: Diagnosis not present

## 2016-11-26 DIAGNOSIS — H26493 Other secondary cataract, bilateral: Secondary | ICD-10-CM | POA: Diagnosis not present

## 2016-12-01 DIAGNOSIS — M545 Low back pain: Secondary | ICD-10-CM | POA: Diagnosis not present

## 2016-12-01 DIAGNOSIS — M170 Bilateral primary osteoarthritis of knee: Secondary | ICD-10-CM | POA: Diagnosis not present

## 2016-12-02 DIAGNOSIS — E0965 Drug or chemical induced diabetes mellitus with hyperglycemia: Secondary | ICD-10-CM | POA: Diagnosis not present

## 2016-12-03 DIAGNOSIS — C44311 Basal cell carcinoma of skin of nose: Secondary | ICD-10-CM | POA: Diagnosis not present

## 2016-12-07 ENCOUNTER — Encounter: Payer: Self-pay | Admitting: Cardiology

## 2016-12-07 ENCOUNTER — Ambulatory Visit (INDEPENDENT_AMBULATORY_CARE_PROVIDER_SITE_OTHER): Payer: Medicare Other | Admitting: Cardiology

## 2016-12-07 VITALS — BP 118/68 | HR 92 | Resp 14 | Ht 72.0 in | Wt 221.8 lb

## 2016-12-07 DIAGNOSIS — I739 Peripheral vascular disease, unspecified: Secondary | ICD-10-CM | POA: Diagnosis not present

## 2016-12-07 DIAGNOSIS — I442 Atrioventricular block, complete: Secondary | ICD-10-CM

## 2016-12-07 NOTE — Patient Instructions (Signed)
Medication Instructions:  Your physician recommends that you continue on your current medications as directed. Please refer to the Current Medication list given to you today.  Labwork: None   Testing/Procedures: EKG today in office.   Your physician has requested that you have a carotid duplex. This test is an ultrasound of the carotid arteries in your neck. It looks at blood flow through these arteries that supply the brain with blood. Allow one hour for this exam. There are no restrictions or special instructions.   Follow-Up: Your physician wants you to follow-up in: 6 months. You will receive a reminder letter in the mail two months in advance. If you don't receive a letter, please call our office to schedule the follow-up appointment.  Any Other Special Instructions Will Be Listed Below (If Applicable).  Please note that any paperwork needing to be filled out by the provider will need to be addressed at the front desk prior to seeing the provider. Please note that any paperwork FMLA, Disability or other documents regarding health condition is subject to a $25.00 charge that must be received prior to completion of paperwork in the form of a money order or check.    If you need a refill on your cardiac medications before your next appointment, please call your pharmacy.

## 2016-12-08 ENCOUNTER — Other Ambulatory Visit: Payer: Self-pay | Admitting: Cardiology

## 2016-12-08 NOTE — Progress Notes (Signed)
Cardiology Office Note:    Date:  12/08/2016   ID:  Thomas Floyd, DOB January 18, 1928, MRN [id number]  PCP:  Angelina Sheriff, MD  Cardiologist:  Jenne Campus, MD    Referring MD: Angelina Sheriff, MD   Chief Complaint  Patient presents with  . Follow-up  Am doing fine  History of Present Illness:    Thomas Floyd is a 83 y.o. male with multiple cardiac issues as well as peripheral vascular disease.  Cardiac wise appears to be doing well.  Denies have any chest pain tightness squeezing pressure burning chest.  Biggest problem that he has this problem with the vision he says he cannot see and he cannot walk well.  Secondly he still continue to smoke few cigarettes.  Past Medical History:  Diagnosis Date  . Atherosclerosis of arteries of extremities ([place])   . Carotid artery stenosis   . Diabetes mellitus   . Hyperlipidemia   . Hypertension   . Hypertensive cardiovascular disease   . PVD (peripheral vascular disease) ([place])   . Skin cancer     Past Surgical History:  Procedure Laterality Date  . APPENDECTOMY    . CATARACT EXTRACTION    . HERNIA REPAIR    . INSERT / REPLACE / REMOVE PACEMAKER     medtronic  . PROSTATECTOMY      Current Medications: Current Meds  Medication Sig  . aspirin 81 MG tablet Take 81 mg by mouth daily.  . Cholecalciferol (VITAMIN D3) 5000 units CAPS Take 1 capsule by mouth daily.  . clopidogrel (PLAVIX) 75 MG tablet Take 1 tablet (75 mg total) by mouth daily with breakfast.  . ferrous sulfate 325 (65 FE) MG EC tablet Take 325 mg by mouth 2 (two) times daily.  . fexofenadine (ALLEGRA) 180 MG tablet Take 180 mg by mouth daily.  . Fluticasone-Salmeterol (ADVAIR) 250-50 MCG/DOSE AEPB Inhale 1 puff into the lungs every 12 (twelve) hours.  . furosemide (LASIX) 40 MG tablet Take 40 mg by mouth daily.   Marland Kitchen leuprolide (LUPRON DEPOT) 11.25 MG injection Inject 11.25 mg into the muscle every 6 (six) months.   Marland Kitchen losartan (COZAAR) 50 MG tablet  Take 50 mg by mouth daily.   Marland Kitchen lubiprostone (AMITIZA) 24 MCG capsule Take 24 mcg by mouth 2 (two) times daily with a meal.  . multivitamin-lutein (OCUVITE-LUTEIN) CAPS capsule Take 1 capsule by mouth daily.  Marland Kitchen omeprazole (PRILOSEC) 40 MG capsule Take 40 mg by mouth daily.  . ranitidine (ZANTAC) 300 MG tablet Take 1 tablet (300 mg total) by mouth daily.  . rosuvastatin (CRESTOR) 20 MG tablet Take 10 mg by mouth daily.  . [DISCONTINUED] potassium chloride (MICRO-K) 10 MEQ CR capsule Take 1 capsule (10 mEq total) by mouth daily.     Allergies:   Altace [ramipril]; Ibuprofen; Ivp dye [iodinated diagnostic agents]; Levaquin [levofloxacin in d5w]; and Metformin and related   Social History   Socioeconomic History  . Marital status: Widowed    Spouse name: None  . Number of children: None  . Years of education: None  . Highest education level: None  Social Needs  . Financial resource strain: None  . Food insecurity - worry: None  . Food insecurity - inability: None  . Transportation needs - medical: None  . Transportation needs - non-medical: None  Occupational History  . None  Tobacco Use  . Smoking status: Former Smoker    Packs/day: 1.00    Years: 60.00  Pack years: 60.00    Types: Cigarettes    Last attempt to quit: 04/26/2012    Years since quitting: 4.6  . Smokeless tobacco: Never Used  Substance and Sexual Activity  . Alcohol use: Yes    Alcohol/week: 12.0 oz    Types: 24 Standard drinks or equivalent per week  . Drug use: No  . Sexual activity: None  Other Topics Concern  . None  Social History Narrative  . None     Family History: The patient's family history includes Diabetes in his father; Lung cancer in his father. ROS:   Please see the history of present illness.    All 14 point review of systems negative except as described per history of present illness  EKGs/Labs/Other Studies Reviewed:      Recent Labs: No results found for requested labs within  last 8760 hours.  Recent Lipid Panel No results found for: CHOL, TRIG, HDL, CHOLHDL, VLDL, LDLCALC, LDLDIRECT  Physical Exam:    VS:  BP 118/68   Pulse 92   Resp 14   Ht 6' (1.829 m)   Wt 221 lb 12.8 oz (100.6 kg)   BMI 30.08 kg/m     Wt Readings from Last 3 Encounters:  12/07/16 221 lb 12.8 oz (100.6 kg)  06/30/13 242 lb (109.8 kg)  05/19/13 245 lb (111.1 kg)     GEN:  Well nourished, well developed in no acute distress HEENT: Normal NECK: No JVD; No carotid bruits LYMPHATICS: No lymphadenopathy CARDIAC: RRR, no murmurs, no rubs, no gallops RESPIRATORY:  Clear to auscultation without rales, wheezing or rhonchi  ABDOMEN: Soft, non-tender, non-distended MUSCULOSKELETAL:  No edema; No deformity  SKIN: Warm and dry LOWER EXTREMITIES: no swelling NEUROLOGIC:  Alert and oriented x 3 PSYCHIATRIC:  Normal affect   ASSESSMENT:    1. PAD (peripheral artery disease) ([place])   2. Complete AV block (H[facility])    PLAN:    In order of problems listed above:  1. Peripheral vascular disease: Stable from that point of view.  I will continue present conservative approach 2. Complete heart block: Pacemaker in place device has been checked on the end of September normal function 3. Essential hypertension: Stable continue present management 4. 2 diabetes stable   Medication Adjustments/Labs and Tests Ordered: Current medicines are reviewed at length with the patient today.  Concerns regarding medicines are outlined above.  Orders Placed This Encounter  Procedures  . EKG 12-Lead   Medication changes: No orders of the defined types were placed in this encounter.   Signed, Park Liter, MD, [facility] 12/08/2016 2:11 PM    [place]

## 2016-12-10 DIAGNOSIS — H3122 Choroidal dystrophy (central areolar) (generalized) (peripapillary): Secondary | ICD-10-CM | POA: Diagnosis not present

## 2016-12-10 DIAGNOSIS — H31013 Macula scars of posterior pole (postinflammatory) (post-traumatic), bilateral: Secondary | ICD-10-CM | POA: Diagnosis not present

## 2016-12-10 DIAGNOSIS — H353232 Exudative age-related macular degeneration, bilateral, with inactive choroidal neovascularization: Secondary | ICD-10-CM | POA: Diagnosis not present

## 2016-12-10 DIAGNOSIS — E785 Hyperlipidemia, unspecified: Secondary | ICD-10-CM | POA: Diagnosis not present

## 2016-12-10 DIAGNOSIS — I6523 Occlusion and stenosis of bilateral carotid arteries: Secondary | ICD-10-CM | POA: Diagnosis not present

## 2016-12-10 DIAGNOSIS — E119 Type 2 diabetes mellitus without complications: Secondary | ICD-10-CM | POA: Diagnosis not present

## 2016-12-11 ENCOUNTER — Telehealth: Payer: Self-pay

## 2016-12-11 ENCOUNTER — Other Ambulatory Visit: Payer: Self-pay

## 2016-12-11 DIAGNOSIS — I739 Peripheral vascular disease, unspecified: Secondary | ICD-10-CM

## 2016-12-11 NOTE — Telephone Encounter (Signed)
Pt advised of results. 

## 2016-12-11 NOTE — Addendum Note (Signed)
Addended by: Kathyrn Sheriff on: 12/11/2016 04:03 PM   Modules accepted: Orders

## 2016-12-23 DIAGNOSIS — R234 Changes in skin texture: Secondary | ICD-10-CM | POA: Diagnosis not present

## 2016-12-23 DIAGNOSIS — E1151 Type 2 diabetes mellitus with diabetic peripheral angiopathy without gangrene: Secondary | ICD-10-CM | POA: Diagnosis not present

## 2016-12-24 DIAGNOSIS — J324 Chronic pansinusitis: Secondary | ICD-10-CM | POA: Diagnosis not present

## 2016-12-24 DIAGNOSIS — J342 Deviated nasal septum: Secondary | ICD-10-CM | POA: Diagnosis not present

## 2016-12-24 DIAGNOSIS — J3089 Other allergic rhinitis: Secondary | ICD-10-CM | POA: Diagnosis not present

## 2016-12-30 ENCOUNTER — Telehealth: Payer: Self-pay | Admitting: Cardiology

## 2016-12-30 DIAGNOSIS — M6281 Muscle weakness (generalized): Secondary | ICD-10-CM | POA: Diagnosis not present

## 2016-12-30 DIAGNOSIS — R262 Difficulty in walking, not elsewhere classified: Secondary | ICD-10-CM | POA: Diagnosis not present

## 2016-12-30 DIAGNOSIS — M5442 Lumbago with sciatica, left side: Secondary | ICD-10-CM | POA: Diagnosis not present

## 2016-12-30 DIAGNOSIS — M545 Low back pain: Secondary | ICD-10-CM | POA: Diagnosis not present

## 2016-12-30 NOTE — Telephone Encounter (Signed)
Wants to know where he's supposed to get his pacemaker checked

## 2016-12-30 NOTE — Telephone Encounter (Signed)
Sent message to EP to schedule patient an appointment.   Tried to contact patient, number was busy.

## 2016-12-31 DIAGNOSIS — H353232 Exudative age-related macular degeneration, bilateral, with inactive choroidal neovascularization: Secondary | ICD-10-CM | POA: Diagnosis not present

## 2017-01-07 ENCOUNTER — Telehealth: Payer: Self-pay

## 2017-01-07 DIAGNOSIS — M5442 Lumbago with sciatica, left side: Secondary | ICD-10-CM | POA: Diagnosis not present

## 2017-01-07 DIAGNOSIS — M545 Low back pain: Secondary | ICD-10-CM | POA: Diagnosis not present

## 2017-01-07 DIAGNOSIS — R262 Difficulty in walking, not elsewhere classified: Secondary | ICD-10-CM | POA: Diagnosis not present

## 2017-01-07 DIAGNOSIS — M6281 Muscle weakness (generalized): Secondary | ICD-10-CM | POA: Diagnosis not present

## 2017-01-07 NOTE — Telephone Encounter (Signed)
Patient requested to continue his EP care in [place] rather than seeing [place]; Dr. Agustin Cree is aware.

## 2017-01-07 NOTE — Telephone Encounter (Signed)
Patient has been scheduled with EP

## 2017-01-08 ENCOUNTER — Encounter: Payer: Medicare Other | Admitting: Cardiology

## 2017-01-08 NOTE — Progress Notes (Deleted)
Electrophysiology Office Note   Date:  01/08/2017   ID:  Thomas Floyd, DOB 10-24-1927, MRN [id number]  PCP:  Thomas Sheriff, MD  Cardiologist:  Thomas Floyd Primary Electrophysiologist:  Thomas Roddy Meredith Leeds, MD    No chief complaint on file.    History of Present Illness: Thomas Floyd is a 83 y.o. male who is being seen today for the evaluation of complete AV block at the request of Thomas Sheriff, MD. Presenting today for electrophysiology evaluation.  He has a history of peripheral vascular disease, complete heart block status post pacemaker implant, hypertension, and type 2 diabetes.    Today, he denies*** symptoms of palpitations, chest pain, shortness of breath, orthopnea, PND, lower extremity edema, claudication, dizziness, presyncope, syncope, bleeding, or neurologic sequela. The patient is tolerating medications without difficulties.    Past Medical History:  Diagnosis Date  . Atherosclerosis of arteries of extremities ([place])   . Carotid artery stenosis   . Diabetes mellitus   . Hyperlipidemia   . Hypertension   . Hypertensive cardiovascular disease   . PVD (peripheral vascular disease) ([place])   . Skin cancer    *** The histories are not reviewed yet. Please review them in the "History" navigator section and refresh this [place].   Current Outpatient Medications  Medication Sig Dispense Refill  . aspirin 81 MG tablet Take 81 mg by mouth daily.    . Cholecalciferol (VITAMIN D3) 5000 units CAPS Take 1 capsule by mouth daily.    . clopidogrel (PLAVIX) 75 MG tablet Take 1 tablet (75 mg total) by mouth daily with breakfast. 30 tablet 6  . ferrous sulfate 325 (65 FE) MG EC tablet Take 325 mg by mouth 2 (two) times daily.    . fexofenadine (ALLEGRA) 180 MG tablet Take 180 mg by mouth daily.    . Fluticasone-Salmeterol (ADVAIR) 250-50 MCG/DOSE AEPB Inhale 1 puff into the lungs every 12 (twelve) hours. 60 each 11  . furosemide (LASIX) 40 MG tablet Take 40  mg by mouth daily.     Marland Kitchen leuprolide (LUPRON DEPOT) 11.25 MG injection Inject 11.25 mg into the muscle every 6 (six) months.     Marland Kitchen losartan (COZAAR) 50 MG tablet Take 50 mg by mouth daily.     Marland Kitchen lubiprostone (AMITIZA) 24 MCG capsule Take 24 mcg by mouth 2 (two) times daily with a meal.    . multivitamin-lutein (OCUVITE-LUTEIN) CAPS capsule Take 1 capsule by mouth daily.    Marland Kitchen omeprazole (PRILOSEC) 40 MG capsule Take 40 mg by mouth daily.    . potassium chloride (K-DUR) 10 MEQ tablet Take 1 tablet (10 mEq total) by mouth daily. 30 tablet 6  . ranitidine (ZANTAC) 300 MG tablet Take 1 tablet (300 mg total) by mouth daily. 30 tablet 0  . rosuvastatin (CRESTOR) 20 MG tablet Take 10 mg by mouth daily.     No current facility-administered medications for this visit.     Allergies:   Altace [ramipril]; Ibuprofen; Ivp dye [iodinated diagnostic agents]; Levaquin [levofloxacin in d5w]; and Metformin and related   Social History:  The patient  reports that he quit smoking about 4 years ago. His smoking use included cigarettes. He has a 60.00 pack-year smoking history. he has never used smokeless tobacco. He reports that he drinks about 12.0 oz of alcohol per week. He reports that he does not use drugs.   Family History:  The patient's family history includes Diabetes in his father; Lung cancer in his  father.    ROS:  Please see the history of present illness.   Otherwise, review of systems is positive for ***.   All other systems are reviewed and negative.    PHYSICAL EXAM: VS:  There were no vitals taken for this visit. , BMI There is no height or weight on file to calculate BMI. GEN: Well nourished, well developed, in no acute distress  HEENT: normal  Neck: no JVD, carotid bruits, or masses Cardiac: ***RRR; no murmurs, rubs, or gallops,no edema  Respiratory:  clear to auscultation bilaterally, normal work of breathing GI: soft, nontender, nondistended, + BS MS: no deformity or atrophy  Skin: warm  and dry, device pocket is well healed Neuro:  Strength and sensation are intact Psych: euthymic mood, full affect  EKG:  EKG {ACTION; IS/IS [id number]} ordered today. Personal review of the ekg ordered shows ***  Device interrogation is reviewed today in detail.  See PaceArt for details.   Recent Labs: No results found for requested labs within last 8760 hours.    Lipid Panel  No results found for: CHOL, TRIG, HDL, CHOLHDL, VLDL, LDLCALC, LDLDIRECT   Wt Readings from Last 3 Encounters:  12/07/16 221 lb 12.8 oz (100.6 kg)  06/30/13 242 lb (109.8 kg)  05/19/13 245 lb (111.1 kg)      Other studies Reviewed: Additional studies/ records that were reviewed today include: TTE 07/23/16  Review of the above records today demonstrates:  Mild concentric LVH LV systolic function mild to moderately impaired EF of 40-45%   ASSESSMENT AND PLAN:  1.  ***    Current medicines are reviewed at length with the patient today.   The patient {ACTIONS; HAS/DOES NOT HAVE:19233} concerns regarding his medicines.  The following changes were made today:  {NONE DEFAULTED:18576::"none"}  Labs/ tests ordered today include: *** No orders of the defined types were placed in this encounter.    Disposition:   FU with Damin Salido {gen number [id number]} {Days to years:10300}  Signed, Taneasha Fuqua Meredith Leeds, MD  01/08/2017 9:45 AM     [facility] [address] [place] [address] [phone] (office) [phone] (fax)

## 2017-01-12 DIAGNOSIS — R29898 Other symptoms and signs involving the musculoskeletal system: Secondary | ICD-10-CM | POA: Diagnosis not present

## 2017-01-12 DIAGNOSIS — R234 Changes in skin texture: Secondary | ICD-10-CM | POA: Diagnosis not present

## 2017-01-12 DIAGNOSIS — M6281 Muscle weakness (generalized): Secondary | ICD-10-CM | POA: Diagnosis not present

## 2017-01-12 DIAGNOSIS — M545 Low back pain: Secondary | ICD-10-CM | POA: Diagnosis not present

## 2017-01-12 DIAGNOSIS — M5442 Lumbago with sciatica, left side: Secondary | ICD-10-CM | POA: Diagnosis not present

## 2017-01-12 DIAGNOSIS — E1151 Type 2 diabetes mellitus with diabetic peripheral angiopathy without gangrene: Secondary | ICD-10-CM | POA: Diagnosis not present

## 2017-01-12 DIAGNOSIS — R262 Difficulty in walking, not elsewhere classified: Secondary | ICD-10-CM | POA: Diagnosis not present

## 2017-01-13 DIAGNOSIS — R4702 Dysphasia: Secondary | ICD-10-CM | POA: Diagnosis not present

## 2017-01-13 DIAGNOSIS — R9389 Abnormal findings on diagnostic imaging of other specified body structures: Secondary | ICD-10-CM | POA: Diagnosis not present

## 2017-01-13 DIAGNOSIS — J329 Chronic sinusitis, unspecified: Secondary | ICD-10-CM | POA: Diagnosis not present

## 2017-01-14 DIAGNOSIS — M6281 Muscle weakness (generalized): Secondary | ICD-10-CM | POA: Diagnosis not present

## 2017-01-14 DIAGNOSIS — M5442 Lumbago with sciatica, left side: Secondary | ICD-10-CM | POA: Diagnosis not present

## 2017-01-14 DIAGNOSIS — R262 Difficulty in walking, not elsewhere classified: Secondary | ICD-10-CM | POA: Diagnosis not present

## 2017-01-14 DIAGNOSIS — M545 Low back pain: Secondary | ICD-10-CM | POA: Diagnosis not present

## 2017-01-15 DIAGNOSIS — Z950 Presence of cardiac pacemaker: Secondary | ICD-10-CM | POA: Diagnosis not present

## 2017-01-20 ENCOUNTER — Encounter: Payer: Self-pay | Admitting: Cardiology

## 2017-01-21 DIAGNOSIS — R262 Difficulty in walking, not elsewhere classified: Secondary | ICD-10-CM | POA: Diagnosis not present

## 2017-01-21 DIAGNOSIS — M6281 Muscle weakness (generalized): Secondary | ICD-10-CM | POA: Diagnosis not present

## 2017-01-21 DIAGNOSIS — M5442 Lumbago with sciatica, left side: Secondary | ICD-10-CM | POA: Diagnosis not present

## 2017-01-21 DIAGNOSIS — M545 Low back pain: Secondary | ICD-10-CM | POA: Diagnosis not present

## 2017-01-27 ENCOUNTER — Other Ambulatory Visit: Payer: Self-pay | Admitting: Otolaryngology

## 2017-01-27 DIAGNOSIS — R1310 Dysphagia, unspecified: Secondary | ICD-10-CM

## 2017-01-28 DIAGNOSIS — R197 Diarrhea, unspecified: Secondary | ICD-10-CM | POA: Diagnosis not present

## 2017-01-29 DIAGNOSIS — R197 Diarrhea, unspecified: Secondary | ICD-10-CM | POA: Diagnosis not present

## 2017-02-02 DIAGNOSIS — L570 Actinic keratosis: Secondary | ICD-10-CM | POA: Diagnosis not present

## 2017-02-03 ENCOUNTER — Other Ambulatory Visit: Payer: Medicare Other

## 2017-02-04 DIAGNOSIS — E871 Hypo-osmolality and hyponatremia: Secondary | ICD-10-CM | POA: Diagnosis not present

## 2017-02-04 DIAGNOSIS — A0472 Enterocolitis due to Clostridium difficile, not specified as recurrent: Secondary | ICD-10-CM | POA: Diagnosis not present

## 2017-02-11 DIAGNOSIS — R29898 Other symptoms and signs involving the musculoskeletal system: Secondary | ICD-10-CM | POA: Diagnosis not present

## 2017-02-11 DIAGNOSIS — R234 Changes in skin texture: Secondary | ICD-10-CM | POA: Diagnosis not present

## 2017-02-11 DIAGNOSIS — E1151 Type 2 diabetes mellitus with diabetic peripheral angiopathy without gangrene: Secondary | ICD-10-CM | POA: Diagnosis not present

## 2017-02-18 DIAGNOSIS — R197 Diarrhea, unspecified: Secondary | ICD-10-CM | POA: Diagnosis not present

## 2017-02-25 DIAGNOSIS — A0472 Enterocolitis due to Clostridium difficile, not specified as recurrent: Secondary | ICD-10-CM | POA: Diagnosis not present

## 2017-02-25 DIAGNOSIS — Z6829 Body mass index (BMI) 29.0-29.9, adult: Secondary | ICD-10-CM | POA: Diagnosis not present

## 2017-02-25 DIAGNOSIS — Z1331 Encounter for screening for depression: Secondary | ICD-10-CM | POA: Diagnosis not present

## 2017-02-25 DIAGNOSIS — Z87448 Personal history of other diseases of urinary system: Secondary | ICD-10-CM | POA: Diagnosis not present

## 2017-02-25 DIAGNOSIS — L299 Pruritus, unspecified: Secondary | ICD-10-CM | POA: Diagnosis not present

## 2017-03-02 ENCOUNTER — Other Ambulatory Visit: Payer: Medicare Other

## 2017-03-09 ENCOUNTER — Telehealth: Payer: Self-pay

## 2017-03-09 NOTE — Telephone Encounter (Signed)
Patient called wanting the number for [facility] cardiology. He states that he is almost 84 years old and does not wish to drive to [place] for management of his device; however, he does wish to keep his care with Dr. Agustin Cree.

## 2017-03-15 ENCOUNTER — Telehealth: Payer: Self-pay | Admitting: Cardiology

## 2017-03-15 NOTE — Telephone Encounter (Signed)
Pt calling regarding his device checks.  Pt states he does not want to come to [place] to have his device checked.  He would prefer to continue having at home device checks and not see Dr Curt Bears in [place].  Pt aware that Dr Raliegh Ip may not see checks after they are scanned into Epic as they are not being completed by [facility].  Pt states he will contact our office to notify Dr Raliegh Ip when he does his checks so that Dr Raliegh Ip can at least look at the results to make sure his device is functioning properly.

## 2017-03-15 NOTE — Telephone Encounter (Signed)
Patient will see Dr. Raliegh Ip in [place] but in the meantime would like to know about a doctor to check his pacemaker. Can we suggest?

## 2017-03-18 DIAGNOSIS — H6123 Impacted cerumen, bilateral: Secondary | ICD-10-CM | POA: Diagnosis not present

## 2017-03-30 DIAGNOSIS — Z6829 Body mass index (BMI) 29.0-29.9, adult: Secondary | ICD-10-CM | POA: Diagnosis not present

## 2017-03-30 DIAGNOSIS — E782 Mixed hyperlipidemia: Secondary | ICD-10-CM | POA: Diagnosis not present

## 2017-03-30 DIAGNOSIS — R3129 Other microscopic hematuria: Secondary | ICD-10-CM | POA: Diagnosis not present

## 2017-03-30 DIAGNOSIS — I10 Essential (primary) hypertension: Secondary | ICD-10-CM | POA: Diagnosis not present

## 2017-03-30 DIAGNOSIS — J441 Chronic obstructive pulmonary disease with (acute) exacerbation: Secondary | ICD-10-CM | POA: Diagnosis not present

## 2017-03-30 DIAGNOSIS — E1143 Type 2 diabetes mellitus with diabetic autonomic (poly)neuropathy: Secondary | ICD-10-CM | POA: Diagnosis not present

## 2017-04-01 DIAGNOSIS — H353232 Exudative age-related macular degeneration, bilateral, with inactive choroidal neovascularization: Secondary | ICD-10-CM | POA: Diagnosis not present

## 2017-04-01 DIAGNOSIS — E113293 Type 2 diabetes mellitus with mild nonproliferative diabetic retinopathy without macular edema, bilateral: Secondary | ICD-10-CM | POA: Diagnosis not present

## 2017-04-01 DIAGNOSIS — H26493 Other secondary cataract, bilateral: Secondary | ICD-10-CM | POA: Diagnosis not present

## 2017-04-06 ENCOUNTER — Encounter: Payer: Self-pay | Admitting: Internal Medicine

## 2017-04-06 DIAGNOSIS — J439 Emphysema, unspecified: Secondary | ICD-10-CM | POA: Diagnosis not present

## 2017-04-06 DIAGNOSIS — I700 Atherosclerosis of aorta: Secondary | ICD-10-CM | POA: Diagnosis not present

## 2017-04-06 DIAGNOSIS — R918 Other nonspecific abnormal finding of lung field: Secondary | ICD-10-CM | POA: Diagnosis not present

## 2017-04-06 DIAGNOSIS — D500 Iron deficiency anemia secondary to blood loss (chronic): Secondary | ICD-10-CM | POA: Diagnosis not present

## 2017-04-13 DIAGNOSIS — F17210 Nicotine dependence, cigarettes, uncomplicated: Secondary | ICD-10-CM | POA: Diagnosis not present

## 2017-04-13 DIAGNOSIS — R05 Cough: Secondary | ICD-10-CM | POA: Diagnosis not present

## 2017-04-13 DIAGNOSIS — J449 Chronic obstructive pulmonary disease, unspecified: Secondary | ICD-10-CM | POA: Diagnosis not present

## 2017-04-22 DIAGNOSIS — C61 Malignant neoplasm of prostate: Secondary | ICD-10-CM | POA: Diagnosis not present

## 2017-04-22 DIAGNOSIS — D509 Iron deficiency anemia, unspecified: Secondary | ICD-10-CM | POA: Diagnosis not present

## 2017-04-26 DIAGNOSIS — Z950 Presence of cardiac pacemaker: Secondary | ICD-10-CM | POA: Diagnosis not present

## 2017-04-27 DIAGNOSIS — C61 Malignant neoplasm of prostate: Secondary | ICD-10-CM | POA: Diagnosis not present

## 2017-04-27 DIAGNOSIS — R739 Hyperglycemia, unspecified: Secondary | ICD-10-CM | POA: Diagnosis not present

## 2017-04-27 DIAGNOSIS — N189 Chronic kidney disease, unspecified: Secondary | ICD-10-CM | POA: Diagnosis not present

## 2017-04-27 DIAGNOSIS — Z79899 Other long term (current) drug therapy: Secondary | ICD-10-CM | POA: Diagnosis not present

## 2017-05-04 DIAGNOSIS — L570 Actinic keratosis: Secondary | ICD-10-CM | POA: Diagnosis not present

## 2017-05-04 DIAGNOSIS — C44622 Squamous cell carcinoma of skin of right upper limb, including shoulder: Secondary | ICD-10-CM | POA: Diagnosis not present

## 2017-05-06 DIAGNOSIS — E1151 Type 2 diabetes mellitus with diabetic peripheral angiopathy without gangrene: Secondary | ICD-10-CM | POA: Diagnosis not present

## 2017-05-06 DIAGNOSIS — H9192 Unspecified hearing loss, left ear: Secondary | ICD-10-CM | POA: Diagnosis not present

## 2017-05-06 DIAGNOSIS — H6121 Impacted cerumen, right ear: Secondary | ICD-10-CM | POA: Diagnosis not present

## 2017-05-06 DIAGNOSIS — H903 Sensorineural hearing loss, bilateral: Secondary | ICD-10-CM | POA: Diagnosis not present

## 2017-05-06 DIAGNOSIS — B351 Tinea unguium: Secondary | ICD-10-CM | POA: Diagnosis not present

## 2017-05-06 DIAGNOSIS — R05 Cough: Secondary | ICD-10-CM | POA: Diagnosis not present

## 2017-05-06 DIAGNOSIS — Z9889 Other specified postprocedural states: Secondary | ICD-10-CM | POA: Diagnosis not present

## 2017-05-06 DIAGNOSIS — Z8709 Personal history of other diseases of the respiratory system: Secondary | ICD-10-CM | POA: Diagnosis not present

## 2017-05-06 DIAGNOSIS — J342 Deviated nasal septum: Secondary | ICD-10-CM | POA: Diagnosis not present

## 2017-05-11 DIAGNOSIS — D500 Iron deficiency anemia secondary to blood loss (chronic): Secondary | ICD-10-CM | POA: Diagnosis not present

## 2017-05-11 DIAGNOSIS — R195 Other fecal abnormalities: Secondary | ICD-10-CM | POA: Diagnosis not present

## 2017-05-13 DIAGNOSIS — I10 Essential (primary) hypertension: Secondary | ICD-10-CM | POA: Diagnosis not present

## 2017-05-13 DIAGNOSIS — H6122 Impacted cerumen, left ear: Secondary | ICD-10-CM | POA: Diagnosis not present

## 2017-05-13 DIAGNOSIS — J449 Chronic obstructive pulmonary disease, unspecified: Secondary | ICD-10-CM | POA: Diagnosis not present

## 2017-05-13 DIAGNOSIS — Z6829 Body mass index (BMI) 29.0-29.9, adult: Secondary | ICD-10-CM | POA: Diagnosis not present

## 2017-05-13 DIAGNOSIS — L989 Disorder of the skin and subcutaneous tissue, unspecified: Secondary | ICD-10-CM | POA: Diagnosis not present

## 2017-05-21 ENCOUNTER — Other Ambulatory Visit: Payer: Self-pay | Admitting: Cardiology

## 2017-05-21 DIAGNOSIS — H04123 Dry eye syndrome of bilateral lacrimal glands: Secondary | ICD-10-CM | POA: Diagnosis not present

## 2017-05-24 DIAGNOSIS — H1013 Acute atopic conjunctivitis, bilateral: Secondary | ICD-10-CM | POA: Diagnosis not present

## 2017-05-25 DIAGNOSIS — C44622 Squamous cell carcinoma of skin of right upper limb, including shoulder: Secondary | ICD-10-CM | POA: Diagnosis not present

## 2017-06-03 ENCOUNTER — Encounter: Payer: Self-pay | Admitting: Internal Medicine

## 2017-06-03 ENCOUNTER — Ambulatory Visit (INDEPENDENT_AMBULATORY_CARE_PROVIDER_SITE_OTHER): Payer: Medicare Other | Admitting: Internal Medicine

## 2017-06-03 VITALS — BP 132/78 | HR 103 | Ht 73.0 in | Wt 218.6 lb

## 2017-06-03 DIAGNOSIS — R911 Solitary pulmonary nodule: Secondary | ICD-10-CM

## 2017-06-03 DIAGNOSIS — F17210 Nicotine dependence, cigarettes, uncomplicated: Secondary | ICD-10-CM | POA: Diagnosis not present

## 2017-06-03 DIAGNOSIS — J328 Other chronic sinusitis: Secondary | ICD-10-CM | POA: Diagnosis not present

## 2017-06-03 DIAGNOSIS — J449 Chronic obstructive pulmonary disease, unspecified: Secondary | ICD-10-CM

## 2017-06-03 DIAGNOSIS — J329 Chronic sinusitis, unspecified: Secondary | ICD-10-CM | POA: Insufficient documentation

## 2017-06-03 NOTE — Assessment & Plan Note (Addendum)
-   rec trial off spiriva 05/19/2013  - PFTs 06/30/2013  FEV1 2.17 ( 73%) ratio 56 and no change p saba p advair in am and DLCO 53% corrects to 55    - Spirometry 06/03/2017  FEV1 1.77 (60%)  Ratio 50 p am breo  With classic curvature   - 06/03/2017  The proper method of use, as well as anticipated side effects, of a metered-dose inhaler are discussed and demonstrated to the patient.     See device teaching which extended face to face time for this visit   As I explained to this patient in detail:  although there is moderate copd present, it may not be clinically relevant:   it does not appear to be limiting activity tolerance any more than a set of worn tires limits someone from driving a car  around a parking lot.  A new set of Michelins might look good but would have no perceived impact on the performance of the car and would not be worth the cost.  That is to say:   this pt is so sedentary I don't recommend aggressive pulmonary rx at this point unless limiting symptoms arise or acute exacerbations become as issue, neither of which is the case now.  I asked the patient to contact this office at any time in the future should either of these problems arise.    He is concerned about the cost of breo and did just as well on advair 250 one each am so that's fine with me but if symptoms worsen on this dose for any reason rec he take the advair twice daily and if not back to baseline consider trelegy instead of BREO as he is unimpressed with the latter.   See device teaching which extended face to face time for this visit  Total time devoted to counseling  > 50 % of "initial" (> 3 y since last ov)  60 min office visit:  review case with pt/ discussion of options/alternatives/ personally creating written customized instructions  in presence of pt  then going over those specific  Instructions directly with the pt including how to use all of the meds but in particular covering each new medication in detail and the  difference between the maintenance= "automatic" meds and the prns using an action plan format for the latter (If this problem/symptom => do that organization reading Left to right).  Please see AVS from this visit for a full list of these instructions which I personally wrote for this pt and  are unique to this visit.

## 2017-06-03 NOTE — Assessment & Plan Note (Signed)
4-5 min discussion re active cigarette smoking in addition to office E&M  Ask about tobacco use:  ongoing Advise quitting  I took an extended  opportunity with this patient to outline the consequences of continued cigarette use  in airway disorders based on all the data we have from the multiple national lung health studies (perfomed over decades at millions of dollars in cost)  indicating that smoking cessation, not choice of inhalers or physicians, is the most important aspect of his care and may improve his sinus symptoms as well   Assess willingness not ready Assist in quit attempt when ready ? Candidate for chantix if can't do it on his own  Arrange follow up. Follow up per Primary Care planned

## 2017-06-03 NOTE — Assessment & Plan Note (Addendum)
CT 04/06/16 s change 6 mm nodule since 07/09/16   CT results reviewed with pt >>> Too small for PET or bx, not suspicious enough for excisional bx > really only option for now is follow the Fleischner society guidelines as rec by radiology.     Given his age of 71 and overall health decline  I would defer to Dr Lin Landsman whether approp to do any further f/u studies at all

## 2017-06-03 NOTE — Patient Instructions (Signed)
Ok to resume advair as you were before just one puff each am but for worse cough/ wheeze, short of breath then go to twice daily and if this is not adequate would change to Trelegy (Dr Lin Landsman can do this for you)   The key is to stop smoking completely before smoking completely stops you!    If you are satisfied with your treatment plan,  let your doctor know and he/she can either refill your medications or you can return here when your prescription runs out.     If in any way you are not 100% satisfied,  please tell us.  If 100% better, tell your friends!  Pulmonary follow up is as needed

## 2017-06-03 NOTE — Assessment & Plan Note (Signed)
He has been to 3 different ent practices and all he to show for it by his hx is "intestinal infection from abx" which I presume is C. Diff   Did rec smoking cessation / saline irriagation and exhale ICS thru the nose with f/u ent prn at Dr Janace Aris discretion

## 2017-06-03 NOTE — Progress Notes (Signed)
Subjective:    Patient ID: Thomas Floyd, male    DOB: 08/01/1927 MRN: [id number]     Brief patient profile:  89   yowm  Active smoker  With GOLD II copd by pfts 06/03/2017     History of Present Illness  Seen 08/04/2007  on ACEi, changed diovan, much better in general but no improvement in cough rattling was some better on spiriva previously but only used a few as needed. Still smoking. Mucinex helps when he remembers to take it.  rec trial off ACEi   September 23, 2007  completely better to his satisfaction with shortness of breath only with exertion and no nocturnal or early morning exacerbation of cough. he attributes the improvement in his symptoms to the addition of PPI by his primary physician and presently takes it at bedtime  rec Stop smoking  No need for rx at this point   05/19/2013 1st [facility] office visit/ Julyanna Scholle off cigs x 04/2012 Chief Complaint  Patient presents with  . Pulmonary Consult    Pt seen here back in 2009.  He was referred back per Dr. Lovette Cliche for eval of cough. He states that the cough does not really bother him- non prod and only occ notices this. His breathing is doing well.   has been maintained on spiriva x sev years  Then added advair recently  and much better with addition also of ranitidine 300 mg at hs  By Dr Lin Landsman and Not limited by breathing from desired activities   rec Continue advair and try off spiriva (if less exercise tolerance then restart )   06/30/2013 f/u ov/Alvey Brockel re: COPD GOLD II on advair 250 one bid doing better x for hands cramping maybe once a day.  Not limited by breathing from desired activities rec Stay on Advair 250 but take it once daily to see what effect this has  Ok to stop the reflux medication to see what effects it has      06/03/2017  extended ov/Tonianne Fine re: re -establish copd/ still smoking and now on [facility] complaining of cost Chief Complaint  Patient presents with  . Pulmonary Consult    Seen here in the  past- last in 2015 and referred back by Dr. Lin Landsman. Pt c/o occ cough that he relates to sinus drainage. He is not bothered by SOB.   Not limited by breathing from desired activities  But by knees and leg muscles  No steps at all/ has elevator  Sleep ok p sleeping med min hob elevation / min am cough > multiple ent rx for abx s benefit ? Complicated by c diff?   No obvious day to day or daytime variability or assoc excess/ purulent sputum or mucus plugs or hemoptysis or cp or chest tightness, subjective wheeze or overt sinus or hb symptoms. No unusual exposure hx or h/o childhood pna/ asthma or knowledge of premature birth.  Sleeping  With min hob  without nocturnal    exacerbation  of respiratory  c/o's or need for noct saba. Also denies any obvious fluctuation of symptoms with weather or environmental changes or other aggravating or alleviating factors except as outlined above   Current Allergies, Complete Past Medical History, Past Surgical History, Family History, and Social History were reviewed in [employer] record.  ROS  The following are not active complaints unless bolded Hoarseness, sore throat, dysphagia, dental problems, itching, sneezing,  nasal congestion or discharge of excess mucus or purulent  secretions, ear ache,   fever, chills, sweats, unintended wt loss or wt gain, classically pleuritic or exertional cp,  orthopnea pnd or arm/hand swelling  or leg swelling, presyncope, palpitations, abdominal pain, anorexia, nausea, vomiting, diarrhea  or change in bowel habits or change in bladder habits, change in stools or change in urine, dysuria, hematuria,  rash, arthralgias, visual complaints, headache, numbness, weakness or ataxia or problems with walking or coordination,  change in mood or  memory.        Current Meds  Medication Sig  . aspirin 81 MG tablet Take 81 mg by mouth daily.  . Cholecalciferol (VITAMIN D3) 5000 units CAPS Take 1 capsule by mouth daily.   . clopidogrel (PLAVIX) 75 MG tablet Take 1 tablet (75 mg total) by mouth daily with breakfast.  . ferrous sulfate 325 (65 FE) MG EC tablet Take 325 mg by mouth 2 (two) times daily.  . fexofenadine (ALLEGRA) 180 MG tablet Take 180 mg by mouth daily.  . fluticasone furoate-vilanterol (BREO ELLIPTA) 100-25 MCG/INH AEPB Inhale 1 puff into the lungs daily.  . furosemide (LASIX) 40 MG tablet Take 40 mg by mouth every other day.   Marland Kitchen leuprolide (LUPRON DEPOT) 11.25 MG injection Inject 11.25 mg into the muscle every 6 (six) months.   Marland Kitchen losartan (COZAAR) 50 MG tablet Take 50 mg by mouth daily.   Marland Kitchen lubiprostone (AMITIZA) 24 MCG capsule Take 24 mcg by mouth 2 (two) times daily with a meal.  . multivitamin-lutein (OCUVITE-LUTEIN) CAPS capsule Take 1 capsule by mouth daily.  Marland Kitchen omeprazole (PRILOSEC) 40 MG capsule Take 40 mg by mouth daily.  . potassium chloride (K-DUR) 10 MEQ tablet Take 1 tablet (10 mEq total) by mouth daily.  . ranitidine (ZANTAC) 300 MG tablet Take 1 tablet (300 mg total) by mouth daily.  . rosuvastatin (CRESTOR) 20 MG tablet Take 10 mg by mouth daily.                  Objective:   Physical Exam  amb obese pleasant wm nad    Vital signs reviewed - Note on arrival 02 sats  98% on RA        06/03/2017         218   05/19/13 245 lb (111.131 kg)  01/02/09 233 lb (105.688 kg)  09/23/07 231 lb (104.781 kg)        HEENT: nl dentition / oropharynx. Nl external ear canals without cough reflex - moderate bilateral non-specific turbinate edema  With surgically deformed R nostril    NECK :  without JVD/Nodes/TM/ nl carotid upstrokes bilaterally   LUNGS: no acc muscle use,  Mild barrel  contour chest wall with bilateral  Distant bs s audible wheeze and  without cough on insp or exp maneuver and mild   Hyperresonant  to  percussion bilaterally     CV:  RRR  no s3 or murmur or increase in P2, and no edema   ABD:  soft and nontender with pos mid insp Hoover's  in the supine  position. No bruits or organomegaly appreciated, bowel sounds nl  MS:   Nl gait/  ext warm without deformities, calf tenderness, cyanosis or clubbing No obvious joint restrictions   SKIN: warm and dry without lesions    NEURO:  alert, approp, nl sensorium with  no motor or cerebellar deficits apparent.         I personally reviewed images and agree with radiology impression as follows:   Chest  CT s contrast 04/06/17 6 mm r lung nodule / centrilobular and pan lobar emphysema  No change since July 09 2016        Assessment & Plan:

## 2017-06-10 DIAGNOSIS — C44622 Squamous cell carcinoma of skin of right upper limb, including shoulder: Secondary | ICD-10-CM | POA: Diagnosis not present

## 2017-06-10 DIAGNOSIS — L820 Inflamed seborrheic keratosis: Secondary | ICD-10-CM | POA: Diagnosis not present

## 2017-06-22 DIAGNOSIS — Z961 Presence of intraocular lens: Secondary | ICD-10-CM | POA: Diagnosis not present

## 2017-06-22 DIAGNOSIS — H353132 Nonexudative age-related macular degeneration, bilateral, intermediate dry stage: Secondary | ICD-10-CM | POA: Diagnosis not present

## 2017-06-26 ENCOUNTER — Other Ambulatory Visit: Payer: Self-pay | Admitting: Cardiology

## 2017-06-29 DIAGNOSIS — R143 Flatulence: Secondary | ICD-10-CM | POA: Diagnosis not present

## 2017-07-04 DIAGNOSIS — J019 Acute sinusitis, unspecified: Secondary | ICD-10-CM | POA: Diagnosis not present

## 2017-07-04 DIAGNOSIS — J0101 Acute recurrent maxillary sinusitis: Secondary | ICD-10-CM | POA: Diagnosis not present

## 2017-07-04 DIAGNOSIS — R05 Cough: Secondary | ICD-10-CM | POA: Diagnosis not present

## 2017-07-04 DIAGNOSIS — J209 Acute bronchitis, unspecified: Secondary | ICD-10-CM | POA: Diagnosis not present

## 2017-07-04 DIAGNOSIS — Z7982 Long term (current) use of aspirin: Secondary | ICD-10-CM | POA: Diagnosis not present

## 2017-07-04 DIAGNOSIS — Z79899 Other long term (current) drug therapy: Secondary | ICD-10-CM | POA: Diagnosis not present

## 2017-07-04 DIAGNOSIS — I252 Old myocardial infarction: Secondary | ICD-10-CM | POA: Diagnosis not present

## 2017-07-04 DIAGNOSIS — J449 Chronic obstructive pulmonary disease, unspecified: Secondary | ICD-10-CM | POA: Diagnosis not present

## 2017-07-04 DIAGNOSIS — E119 Type 2 diabetes mellitus without complications: Secondary | ICD-10-CM | POA: Diagnosis not present

## 2017-07-04 DIAGNOSIS — I10 Essential (primary) hypertension: Secondary | ICD-10-CM | POA: Diagnosis not present

## 2017-07-04 DIAGNOSIS — Z87891 Personal history of nicotine dependence: Secondary | ICD-10-CM | POA: Diagnosis not present

## 2017-07-06 DIAGNOSIS — H26493 Other secondary cataract, bilateral: Secondary | ICD-10-CM | POA: Diagnosis not present

## 2017-07-06 DIAGNOSIS — H353232 Exudative age-related macular degeneration, bilateral, with inactive choroidal neovascularization: Secondary | ICD-10-CM | POA: Diagnosis not present

## 2017-07-13 DIAGNOSIS — I10 Essential (primary) hypertension: Secondary | ICD-10-CM | POA: Diagnosis not present

## 2017-07-13 DIAGNOSIS — Z1339 Encounter for screening examination for other mental health and behavioral disorders: Secondary | ICD-10-CM | POA: Diagnosis not present

## 2017-07-13 DIAGNOSIS — E1143 Type 2 diabetes mellitus with diabetic autonomic (poly)neuropathy: Secondary | ICD-10-CM | POA: Diagnosis not present

## 2017-07-13 DIAGNOSIS — Z0000 Encounter for general adult medical examination without abnormal findings: Secondary | ICD-10-CM | POA: Diagnosis not present

## 2017-07-13 DIAGNOSIS — Z1331 Encounter for screening for depression: Secondary | ICD-10-CM | POA: Diagnosis not present

## 2017-07-13 DIAGNOSIS — Z9181 History of falling: Secondary | ICD-10-CM | POA: Diagnosis not present

## 2017-07-13 DIAGNOSIS — R351 Nocturia: Secondary | ICD-10-CM | POA: Diagnosis not present

## 2017-07-20 DIAGNOSIS — M1712 Unilateral primary osteoarthritis, left knee: Secondary | ICD-10-CM | POA: Diagnosis not present

## 2017-07-22 DIAGNOSIS — R351 Nocturia: Secondary | ICD-10-CM | POA: Diagnosis not present

## 2017-07-22 DIAGNOSIS — C61 Malignant neoplasm of prostate: Secondary | ICD-10-CM | POA: Diagnosis not present

## 2017-07-22 DIAGNOSIS — R3121 Asymptomatic microscopic hematuria: Secondary | ICD-10-CM | POA: Diagnosis not present

## 2017-07-26 DIAGNOSIS — Z950 Presence of cardiac pacemaker: Secondary | ICD-10-CM | POA: Diagnosis not present

## 2017-07-27 DIAGNOSIS — K219 Gastro-esophageal reflux disease without esophagitis: Secondary | ICD-10-CM | POA: Diagnosis not present

## 2017-07-27 DIAGNOSIS — J329 Chronic sinusitis, unspecified: Secondary | ICD-10-CM | POA: Diagnosis not present

## 2017-07-27 DIAGNOSIS — Z7289 Other problems related to lifestyle: Secondary | ICD-10-CM | POA: Diagnosis not present

## 2017-07-27 DIAGNOSIS — Z87891 Personal history of nicotine dependence: Secondary | ICD-10-CM | POA: Diagnosis not present

## 2017-08-04 DIAGNOSIS — S9002XA Contusion of left ankle, initial encounter: Secondary | ICD-10-CM | POA: Diagnosis not present

## 2017-08-04 DIAGNOSIS — S93401A Sprain of unspecified ligament of right ankle, initial encounter: Secondary | ICD-10-CM | POA: Diagnosis not present

## 2017-08-10 DIAGNOSIS — Z1331 Encounter for screening for depression: Secondary | ICD-10-CM | POA: Diagnosis not present

## 2017-08-10 DIAGNOSIS — Z9181 History of falling: Secondary | ICD-10-CM | POA: Diagnosis not present

## 2017-08-10 DIAGNOSIS — Z6828 Body mass index (BMI) 28.0-28.9, adult: Secondary | ICD-10-CM | POA: Diagnosis not present

## 2017-08-10 DIAGNOSIS — N400 Enlarged prostate without lower urinary tract symptoms: Secondary | ICD-10-CM | POA: Diagnosis not present

## 2017-08-11 ENCOUNTER — Telehealth: Payer: Self-pay | Admitting: Cardiology

## 2017-08-11 NOTE — Telephone Encounter (Signed)
Clarified with the pharmacy that there is no other form of plavix available. Educated the patient that he could try to take the medication with apple sauce or ice cream. Patient was agreeable and satisfied with this answer.

## 2017-08-11 NOTE — Telephone Encounter (Signed)
Can his clopidogrel 75 mg tablet be changed to a capsule? He is having a really hard time swallowing the tablet. [facility] [place]

## 2017-08-17 DIAGNOSIS — E1151 Type 2 diabetes mellitus with diabetic peripheral angiopathy without gangrene: Secondary | ICD-10-CM | POA: Diagnosis not present

## 2017-08-17 DIAGNOSIS — B351 Tinea unguium: Secondary | ICD-10-CM | POA: Diagnosis not present

## 2017-08-20 ENCOUNTER — Telehealth: Payer: Self-pay | Admitting: *Deleted

## 2017-08-20 NOTE — Telephone Encounter (Signed)
Patient states he talked to [facility] and they would not refill his crestor 20 mg tablets because he needed an office visit. Patient's last office visit was on 12/07/17 and he is scheduled for an appointment on 08/24/17 at 10: 20 am. Medication refilled with [facility]. Patient notified. No further questions.

## 2017-08-24 ENCOUNTER — Encounter: Payer: Self-pay | Admitting: Cardiology

## 2017-08-24 ENCOUNTER — Ambulatory Visit (INDEPENDENT_AMBULATORY_CARE_PROVIDER_SITE_OTHER): Payer: Medicare Other | Admitting: Cardiology

## 2017-08-24 VITALS — BP 118/66 | HR 97 | Ht 73.0 in | Wt 220.0 lb

## 2017-08-24 DIAGNOSIS — I10 Essential (primary) hypertension: Secondary | ICD-10-CM

## 2017-08-24 DIAGNOSIS — I739 Peripheral vascular disease, unspecified: Secondary | ICD-10-CM

## 2017-08-24 DIAGNOSIS — I442 Atrioventricular block, complete: Secondary | ICD-10-CM

## 2017-08-24 DIAGNOSIS — I2510 Atherosclerotic heart disease of native coronary artery without angina pectoris: Secondary | ICD-10-CM

## 2017-08-24 DIAGNOSIS — E785 Hyperlipidemia, unspecified: Secondary | ICD-10-CM | POA: Diagnosis not present

## 2017-08-24 NOTE — Progress Notes (Signed)
Cardiology Office Note:    Date:  08/24/2017   ID:  Thomas Floyd, DOB Jun 10, 1927, MRN [id number]  PCP:  Angelina Sheriff, MD  Cardiologist:  Jenne Campus, MD    Referring MD: Angelina Sheriff, MD   No chief complaint on file. Doing well  History of Present Illness:    Thomas Floyd is a 84 y.o. male with coronary artery disease peripheral vascular disease diabetes legally blind comes to our office for follow-up overall doing well denies have any chest pain tightness squeezing pressure burning chest.  Because of eye problem he had difficulty moving around complaint also having some leg pain.  But otherwise seems to be doing well  Past Medical History:  Diagnosis Date  . Atherosclerosis of arteries of extremities ([place])   . Carotid artery stenosis   . Diabetes mellitus   . Hyperlipidemia   . Hypertension   . Hypertensive cardiovascular disease   . PVD (peripheral vascular disease) ([place])   . Skin cancer     Past Surgical History:  Procedure Laterality Date  . APPENDECTOMY    . CATARACT EXTRACTION    . HERNIA REPAIR    . INSERT / REPLACE / REMOVE PACEMAKER     medtronic  . PROSTATECTOMY      Current Medications: Current Meds  Medication Sig  . aspirin 81 MG tablet Take 81 mg by mouth daily.  . Cholecalciferol (VITAMIN D3) 5000 units CAPS Take 1 capsule by mouth daily.  . clopidogrel (PLAVIX) 75 MG tablet Take 1 tablet (75 mg total) by mouth daily with breakfast.  . ferrous sulfate 325 (65 FE) MG EC tablet Take 325 mg by mouth 2 (two) times daily.  . fexofenadine (ALLEGRA) 180 MG tablet Take 180 mg by mouth daily.  . fluticasone furoate-vilanterol (BREO ELLIPTA) 100-25 MCG/INH AEPB Inhale 1 puff into the lungs daily.  . furosemide (LASIX) 40 MG tablet Take 40 mg by mouth every other day.   Marland Kitchen leuprolide (LUPRON DEPOT) 11.25 MG injection Inject 11.25 mg into the muscle every 6 (six) months.   Marland Kitchen losartan (COZAAR) 50 MG tablet Take 50 mg by mouth daily.   Marland Kitchen  lubiprostone (AMITIZA) 24 MCG capsule Take 24 mcg by mouth 2 (two) times daily with a meal.  . multivitamin-lutein (OCUVITE-LUTEIN) CAPS capsule Take 1 capsule by mouth daily.  Marland Kitchen omeprazole (PRILOSEC) 40 MG capsule Take 40 mg by mouth daily.  . potassium chloride (K-DUR) 10 MEQ tablet TAKE ONE TABLET BY MOUTH DAILY  . ranitidine (ZANTAC) 300 MG tablet Take 1 tablet (300 mg total) by mouth daily.  . rosuvastatin (CRESTOR) 20 MG tablet Take 10 mg by mouth daily.     Allergies:   Altace [ramipril]; Clindamycin/lincomycin; Ibuprofen; Ivp dye [iodinated diagnostic agents]; Levaquin [levofloxacin in d5w]; and Metformin and related   Social History   Socioeconomic History  . Marital status: Widowed    Spouse name: Not on file  . Number of children: Not on file  . Years of education: Not on file  . Highest education level: Not on file  Occupational History  . Not on file  Social Needs  . Financial resource strain: Not on file  . Food insecurity:    Worry: Not on file    Inability: Not on file  . Transportation needs:    Medical: Not on file    Non-medical: Not on file  Tobacco Use  . Smoking status: Current Every Day Smoker    Packs/day: 1.00  Years: 60.00    Pack years: 60.00    Types: Cigarettes  . Smokeless tobacco: Never Used  Substance and Sexual Activity  . Alcohol use: Yes    Alcohol/week: 14.4 oz    Types: 24 Standard drinks or equivalent per week  . Drug use: No  . Sexual activity: Not on file  Lifestyle  . Physical activity:    Days per week: Not on file    Minutes per session: Not on file  . Stress: Not on file  Relationships  . Social connections:    Talks on phone: Not on file    Gets together: Not on file    Attends religious service: Not on file    Active member of club or organization: Not on file    Attends meetings of clubs or organizations: Not on file    Relationship status: Not on file  Other Topics Concern  . Not on file  Social History  Narrative  . Not on file     Family History: The patient's family history includes Diabetes in his father; Lung cancer in his father. ROS:   Please see the history of present illness.    All 14 point review of systems negative except as described per history of present illness  EKGs/Labs/Other Studies Reviewed:      Recent Labs: No results found for requested labs within last 8760 hours.  Recent Lipid Panel No results found for: CHOL, TRIG, HDL, CHOLHDL, VLDL, LDLCALC, LDLDIRECT  Physical Exam:    VS:  BP 118/66 (BP Location: Right Arm, Patient Position: Sitting, Cuff Size: Normal)   Pulse 97   Ht 6\' 1"  (1.854 m)   Wt 220 lb (99.8 kg)   SpO2 100%   BMI 29.03 kg/m     Wt Readings from Last 3 Encounters:  08/24/17 220 lb (99.8 kg)  06/03/17 218 lb 9.6 oz (99.2 kg)  12/07/16 221 lb 12.8 oz (100.6 kg)     GEN:  Well nourished, well developed in no acute distress HEENT: Normal NECK: No JVD; No carotid bruits LYMPHATICS: No lymphadenopathy CARDIAC: RRR, no murmurs, no rubs, no gallops RESPIRATORY:  Clear to auscultation without rales, wheezing or rhonchi  ABDOMEN: Soft, non-tender, non-distended MUSCULOSKELETAL:  No edema; No deformity  SKIN: Warm and dry LOWER EXTREMITIES: no swelling NEUROLOGIC:  Alert and oriented x 3 PSYCHIATRIC:  Normal affect   ASSESSMENT:    1. Coronary artery disease involving native coronary artery of native heart without angina pectoris   2. Complete AV block ([place])   3. Benign hypertension   4. Dyslipidemia    PLAN:    In order of problems listed above:  1. Coronary artery disease stable on appropriate medications which I will continue. 2. Complete heart block managed with pacemaker.  Pacemaker was interrogated.  We will enroll him to our pacemaker clinic 3. Essential hypertension blood pressure well controlled we will continue present management 4. Dyslipidemia: We will call primary care physician to get fasting lipid  profile 5. Peripheral vascular disease: He does have carotid arterial disease will schedule him to have repeated carotid ultrasounds in November.   Medication Adjustments/Labs and Tests Ordered: Current medicines are reviewed at length with the patient today.  Concerns regarding medicines are outlined above.  No orders of the defined types were placed in this encounter.  Medication changes: No orders of the defined types were placed in this encounter.   Signed, Park Liter, MD, [facility] 08/24/2017 11:01 AM  [facility]

## 2017-08-24 NOTE — Patient Instructions (Signed)
Medication Instructions:  Your physician recommends that you continue on your current medications as directed. Please refer to the Current Medication list given to you today.  Labwork: None  Testing/Procedures: Your physician has requested that you have a carotid duplex. This test is an ultrasound of the carotid arteries in your neck. It looks at blood flow through these arteries that supply the brain with blood. Allow one hour for this exam. There are no restrictions or special instructions.  Follow-Up: Your physician recommends that you schedule a follow-up appointment in: 5 months  Any Other Special Instructions Will Be Listed Below (If Applicable).     If you need a refill on your cardiac medications before your next appointment, please call your pharmacy.   [place], RN, BSN

## 2017-08-31 DIAGNOSIS — R3121 Asymptomatic microscopic hematuria: Secondary | ICD-10-CM | POA: Diagnosis not present

## 2017-08-31 DIAGNOSIS — R351 Nocturia: Secondary | ICD-10-CM | POA: Diagnosis not present

## 2017-09-09 DIAGNOSIS — C44629 Squamous cell carcinoma of skin of left upper limb, including shoulder: Secondary | ICD-10-CM | POA: Diagnosis not present

## 2017-09-09 DIAGNOSIS — L570 Actinic keratosis: Secondary | ICD-10-CM | POA: Diagnosis not present

## 2017-09-16 DIAGNOSIS — H353221 Exudative age-related macular degeneration, left eye, with active choroidal neovascularization: Secondary | ICD-10-CM | POA: Diagnosis not present

## 2017-09-21 DIAGNOSIS — C44622 Squamous cell carcinoma of skin of right upper limb, including shoulder: Secondary | ICD-10-CM | POA: Diagnosis not present

## 2017-09-30 ENCOUNTER — Ambulatory Visit (INDEPENDENT_AMBULATORY_CARE_PROVIDER_SITE_OTHER): Payer: Medicare Other

## 2017-09-30 DIAGNOSIS — I739 Peripheral vascular disease, unspecified: Secondary | ICD-10-CM | POA: Diagnosis not present

## 2017-09-30 NOTE — Progress Notes (Signed)
Complete carotid duplex has been performed, Diffuse atherosclerosis and  Mild ICA stenosis seen bilaterally.  Thomas Floyd

## 2017-10-07 DIAGNOSIS — D509 Iron deficiency anemia, unspecified: Secondary | ICD-10-CM | POA: Diagnosis not present

## 2017-10-07 DIAGNOSIS — C61 Malignant neoplasm of prostate: Secondary | ICD-10-CM | POA: Diagnosis not present

## 2017-10-12 DIAGNOSIS — Z79899 Other long term (current) drug therapy: Secondary | ICD-10-CM | POA: Diagnosis not present

## 2017-10-12 DIAGNOSIS — Z23 Encounter for immunization: Secondary | ICD-10-CM | POA: Diagnosis not present

## 2017-10-12 DIAGNOSIS — C61 Malignant neoplasm of prostate: Secondary | ICD-10-CM | POA: Diagnosis not present

## 2017-10-14 DIAGNOSIS — C44629 Squamous cell carcinoma of skin of left upper limb, including shoulder: Secondary | ICD-10-CM | POA: Diagnosis not present

## 2017-10-21 DIAGNOSIS — H353231 Exudative age-related macular degeneration, bilateral, with active choroidal neovascularization: Secondary | ICD-10-CM | POA: Diagnosis not present

## 2017-11-02 DIAGNOSIS — M1712 Unilateral primary osteoarthritis, left knee: Secondary | ICD-10-CM | POA: Diagnosis not present

## 2017-11-04 ENCOUNTER — Encounter: Payer: Self-pay | Admitting: Cardiology

## 2017-11-04 DIAGNOSIS — Z45018 Encounter for adjustment and management of other part of cardiac pacemaker: Secondary | ICD-10-CM | POA: Diagnosis not present

## 2017-11-04 DIAGNOSIS — I442 Atrioventricular block, complete: Secondary | ICD-10-CM | POA: Diagnosis not present

## 2017-11-11 DIAGNOSIS — Z2821 Immunization not carried out because of patient refusal: Secondary | ICD-10-CM | POA: Diagnosis not present

## 2017-11-11 DIAGNOSIS — H6123 Impacted cerumen, bilateral: Secondary | ICD-10-CM | POA: Diagnosis not present

## 2017-11-11 DIAGNOSIS — S39011A Strain of muscle, fascia and tendon of abdomen, initial encounter: Secondary | ICD-10-CM | POA: Diagnosis not present

## 2017-11-11 DIAGNOSIS — Z1331 Encounter for screening for depression: Secondary | ICD-10-CM | POA: Diagnosis not present

## 2017-11-11 DIAGNOSIS — Z6829 Body mass index (BMI) 29.0-29.9, adult: Secondary | ICD-10-CM | POA: Diagnosis not present

## 2017-11-12 DIAGNOSIS — D500 Iron deficiency anemia secondary to blood loss (chronic): Secondary | ICD-10-CM | POA: Diagnosis not present

## 2017-11-16 DIAGNOSIS — R195 Other fecal abnormalities: Secondary | ICD-10-CM | POA: Diagnosis not present

## 2017-11-16 DIAGNOSIS — D500 Iron deficiency anemia secondary to blood loss (chronic): Secondary | ICD-10-CM | POA: Diagnosis not present

## 2017-11-17 ENCOUNTER — Other Ambulatory Visit: Payer: Self-pay | Admitting: Cardiology

## 2017-11-18 DIAGNOSIS — N281 Cyst of kidney, acquired: Secondary | ICD-10-CM | POA: Diagnosis not present

## 2017-11-18 DIAGNOSIS — F17210 Nicotine dependence, cigarettes, uncomplicated: Secondary | ICD-10-CM | POA: Diagnosis not present

## 2017-11-18 DIAGNOSIS — Z7902 Long term (current) use of antithrombotics/antiplatelets: Secondary | ICD-10-CM | POA: Diagnosis not present

## 2017-11-18 DIAGNOSIS — D494 Neoplasm of unspecified behavior of bladder: Secondary | ICD-10-CM | POA: Diagnosis not present

## 2017-11-18 DIAGNOSIS — Z7982 Long term (current) use of aspirin: Secondary | ICD-10-CM | POA: Diagnosis not present

## 2017-11-18 DIAGNOSIS — R58 Hemorrhage, not elsewhere classified: Secondary | ICD-10-CM | POA: Diagnosis not present

## 2017-11-18 DIAGNOSIS — Z8546 Personal history of malignant neoplasm of prostate: Secondary | ICD-10-CM | POA: Diagnosis not present

## 2017-11-18 DIAGNOSIS — R310 Gross hematuria: Secondary | ICD-10-CM | POA: Diagnosis not present

## 2017-11-18 DIAGNOSIS — Z950 Presence of cardiac pacemaker: Secondary | ICD-10-CM | POA: Diagnosis not present

## 2017-11-18 DIAGNOSIS — Z79899 Other long term (current) drug therapy: Secondary | ICD-10-CM | POA: Diagnosis not present

## 2017-11-20 DIAGNOSIS — Z741 Need for assistance with personal care: Secondary | ICD-10-CM | POA: Diagnosis not present

## 2017-11-20 DIAGNOSIS — R2689 Other abnormalities of gait and mobility: Secondary | ICD-10-CM | POA: Diagnosis not present

## 2017-11-20 DIAGNOSIS — D494 Neoplasm of unspecified behavior of bladder: Secondary | ICD-10-CM | POA: Diagnosis not present

## 2017-11-20 DIAGNOSIS — R531 Weakness: Secondary | ICD-10-CM | POA: Diagnosis not present

## 2017-11-21 DIAGNOSIS — D494 Neoplasm of unspecified behavior of bladder: Secondary | ICD-10-CM | POA: Diagnosis not present

## 2017-11-21 DIAGNOSIS — Z741 Need for assistance with personal care: Secondary | ICD-10-CM | POA: Diagnosis not present

## 2017-11-21 DIAGNOSIS — R531 Weakness: Secondary | ICD-10-CM | POA: Diagnosis not present

## 2017-11-21 DIAGNOSIS — R2689 Other abnormalities of gait and mobility: Secondary | ICD-10-CM | POA: Diagnosis not present

## 2017-11-22 DIAGNOSIS — I119 Hypertensive heart disease without heart failure: Secondary | ICD-10-CM | POA: Diagnosis not present

## 2017-11-22 DIAGNOSIS — Z741 Need for assistance with personal care: Secondary | ICD-10-CM | POA: Diagnosis not present

## 2017-11-22 DIAGNOSIS — R2689 Other abnormalities of gait and mobility: Secondary | ICD-10-CM | POA: Diagnosis not present

## 2017-11-22 DIAGNOSIS — R262 Difficulty in walking, not elsewhere classified: Secondary | ICD-10-CM | POA: Diagnosis not present

## 2017-11-22 DIAGNOSIS — R531 Weakness: Secondary | ICD-10-CM | POA: Diagnosis not present

## 2017-11-22 DIAGNOSIS — D494 Neoplasm of unspecified behavior of bladder: Secondary | ICD-10-CM | POA: Diagnosis not present

## 2017-11-22 DIAGNOSIS — M810 Age-related osteoporosis without current pathological fracture: Secondary | ICD-10-CM | POA: Diagnosis not present

## 2017-11-23 DIAGNOSIS — D494 Neoplasm of unspecified behavior of bladder: Secondary | ICD-10-CM | POA: Diagnosis not present

## 2017-11-23 DIAGNOSIS — Z741 Need for assistance with personal care: Secondary | ICD-10-CM | POA: Diagnosis not present

## 2017-11-23 DIAGNOSIS — R2689 Other abnormalities of gait and mobility: Secondary | ICD-10-CM | POA: Diagnosis not present

## 2017-11-23 DIAGNOSIS — R531 Weakness: Secondary | ICD-10-CM | POA: Diagnosis not present

## 2017-11-24 DIAGNOSIS — Z741 Need for assistance with personal care: Secondary | ICD-10-CM | POA: Diagnosis not present

## 2017-11-24 DIAGNOSIS — R2689 Other abnormalities of gait and mobility: Secondary | ICD-10-CM | POA: Diagnosis not present

## 2017-11-24 DIAGNOSIS — R531 Weakness: Secondary | ICD-10-CM | POA: Diagnosis not present

## 2017-11-24 DIAGNOSIS — D494 Neoplasm of unspecified behavior of bladder: Secondary | ICD-10-CM | POA: Diagnosis not present

## 2017-11-25 DIAGNOSIS — D494 Neoplasm of unspecified behavior of bladder: Secondary | ICD-10-CM | POA: Diagnosis not present

## 2017-11-25 DIAGNOSIS — N3090 Cystitis, unspecified without hematuria: Secondary | ICD-10-CM | POA: Diagnosis not present

## 2017-11-25 DIAGNOSIS — R531 Weakness: Secondary | ICD-10-CM | POA: Diagnosis not present

## 2017-11-25 DIAGNOSIS — N3021 Other chronic cystitis with hematuria: Secondary | ICD-10-CM | POA: Diagnosis not present

## 2017-11-25 DIAGNOSIS — R2689 Other abnormalities of gait and mobility: Secondary | ICD-10-CM | POA: Diagnosis not present

## 2017-11-25 DIAGNOSIS — Z741 Need for assistance with personal care: Secondary | ICD-10-CM | POA: Diagnosis not present

## 2017-11-25 DIAGNOSIS — R310 Gross hematuria: Secondary | ICD-10-CM | POA: Diagnosis not present

## 2017-11-25 DIAGNOSIS — C672 Malignant neoplasm of lateral wall of bladder: Secondary | ICD-10-CM | POA: Diagnosis not present

## 2017-11-26 DIAGNOSIS — D090 Carcinoma in situ of bladder: Secondary | ICD-10-CM | POA: Diagnosis not present

## 2017-11-26 DIAGNOSIS — Z01818 Encounter for other preprocedural examination: Secondary | ICD-10-CM | POA: Diagnosis not present

## 2017-11-26 DIAGNOSIS — E1151 Type 2 diabetes mellitus with diabetic peripheral angiopathy without gangrene: Secondary | ICD-10-CM | POA: Diagnosis not present

## 2017-11-26 DIAGNOSIS — E1140 Type 2 diabetes mellitus with diabetic neuropathy, unspecified: Secondary | ICD-10-CM | POA: Diagnosis not present

## 2017-11-26 DIAGNOSIS — N3081 Other cystitis with hematuria: Secondary | ICD-10-CM | POA: Diagnosis not present

## 2017-11-26 DIAGNOSIS — E1122 Type 2 diabetes mellitus with diabetic chronic kidney disease: Secondary | ICD-10-CM | POA: Diagnosis not present

## 2017-11-26 DIAGNOSIS — N181 Chronic kidney disease, stage 1: Secondary | ICD-10-CM | POA: Diagnosis not present

## 2017-11-26 DIAGNOSIS — D649 Anemia, unspecified: Secondary | ICD-10-CM | POA: Diagnosis not present

## 2017-11-26 DIAGNOSIS — Z8546 Personal history of malignant neoplasm of prostate: Secondary | ICD-10-CM | POA: Diagnosis not present

## 2017-11-26 DIAGNOSIS — C672 Malignant neoplasm of lateral wall of bladder: Secondary | ICD-10-CM | POA: Diagnosis not present

## 2017-11-26 DIAGNOSIS — Z950 Presence of cardiac pacemaker: Secondary | ICD-10-CM | POA: Diagnosis not present

## 2017-11-26 DIAGNOSIS — R310 Gross hematuria: Secondary | ICD-10-CM | POA: Diagnosis not present

## 2017-11-26 DIAGNOSIS — Z7982 Long term (current) use of aspirin: Secondary | ICD-10-CM | POA: Diagnosis not present

## 2017-11-26 DIAGNOSIS — I1310 Hypertensive heart and chronic kidney disease without heart failure, with stage 1 through stage 4 chronic kidney disease, or unspecified chronic kidney disease: Secondary | ICD-10-CM | POA: Diagnosis not present

## 2017-11-26 DIAGNOSIS — Z7902 Long term (current) use of antithrombotics/antiplatelets: Secondary | ICD-10-CM | POA: Diagnosis not present

## 2017-11-26 DIAGNOSIS — K219 Gastro-esophageal reflux disease without esophagitis: Secondary | ICD-10-CM | POA: Diagnosis not present

## 2017-11-26 DIAGNOSIS — N3091 Cystitis, unspecified with hematuria: Secondary | ICD-10-CM | POA: Diagnosis not present

## 2017-11-26 DIAGNOSIS — Z85828 Personal history of other malignant neoplasm of skin: Secondary | ICD-10-CM | POA: Diagnosis not present

## 2017-11-26 DIAGNOSIS — Z87891 Personal history of nicotine dependence: Secondary | ICD-10-CM | POA: Diagnosis not present

## 2017-11-27 DIAGNOSIS — D649 Anemia, unspecified: Secondary | ICD-10-CM | POA: Diagnosis not present

## 2017-11-27 DIAGNOSIS — N3081 Other cystitis with hematuria: Secondary | ICD-10-CM | POA: Diagnosis not present

## 2017-11-27 DIAGNOSIS — N181 Chronic kidney disease, stage 1: Secondary | ICD-10-CM | POA: Diagnosis not present

## 2017-11-27 DIAGNOSIS — D090 Carcinoma in situ of bladder: Secondary | ICD-10-CM | POA: Diagnosis not present

## 2017-11-27 DIAGNOSIS — I1310 Hypertensive heart and chronic kidney disease without heart failure, with stage 1 through stage 4 chronic kidney disease, or unspecified chronic kidney disease: Secondary | ICD-10-CM | POA: Diagnosis not present

## 2017-11-27 DIAGNOSIS — E1122 Type 2 diabetes mellitus with diabetic chronic kidney disease: Secondary | ICD-10-CM | POA: Diagnosis not present

## 2017-11-29 ENCOUNTER — Telehealth: Payer: Self-pay | Admitting: Cardiology

## 2017-11-29 DIAGNOSIS — H3530 Unspecified macular degeneration: Secondary | ICD-10-CM | POA: Diagnosis not present

## 2017-11-29 DIAGNOSIS — K219 Gastro-esophageal reflux disease without esophagitis: Secondary | ICD-10-CM | POA: Diagnosis not present

## 2017-11-29 DIAGNOSIS — I10 Essential (primary) hypertension: Secondary | ICD-10-CM | POA: Diagnosis not present

## 2017-11-29 DIAGNOSIS — M810 Age-related osteoporosis without current pathological fracture: Secondary | ICD-10-CM | POA: Diagnosis not present

## 2017-11-29 DIAGNOSIS — D649 Anemia, unspecified: Secondary | ICD-10-CM | POA: Diagnosis not present

## 2017-11-29 DIAGNOSIS — N400 Enlarged prostate without lower urinary tract symptoms: Secondary | ICD-10-CM | POA: Diagnosis not present

## 2017-11-29 DIAGNOSIS — Z87440 Personal history of urinary (tract) infections: Secondary | ICD-10-CM | POA: Diagnosis not present

## 2017-11-29 DIAGNOSIS — S81811D Laceration without foreign body, right lower leg, subsequent encounter: Secondary | ICD-10-CM | POA: Diagnosis not present

## 2017-11-29 DIAGNOSIS — D494 Neoplasm of unspecified behavior of bladder: Secondary | ICD-10-CM | POA: Diagnosis not present

## 2017-11-29 DIAGNOSIS — H548 Legal blindness, as defined in USA: Secondary | ICD-10-CM | POA: Diagnosis not present

## 2017-11-29 DIAGNOSIS — E119 Type 2 diabetes mellitus without complications: Secondary | ICD-10-CM | POA: Diagnosis not present

## 2017-11-29 DIAGNOSIS — M1991 Primary osteoarthritis, unspecified site: Secondary | ICD-10-CM | POA: Diagnosis not present

## 2017-11-29 DIAGNOSIS — Z950 Presence of cardiac pacemaker: Secondary | ICD-10-CM | POA: Diagnosis not present

## 2017-11-29 DIAGNOSIS — C61 Malignant neoplasm of prostate: Secondary | ICD-10-CM | POA: Diagnosis not present

## 2017-11-29 DIAGNOSIS — R339 Retention of urine, unspecified: Secondary | ICD-10-CM | POA: Diagnosis not present

## 2017-11-29 NOTE — Telephone Encounter (Signed)
Patient daughter in law states he had outpatient surgery and he had to stop Plavix and aspirin. She is his POA and wants to know when can he restart? She is faxing POA paperwork.

## 2017-11-30 DIAGNOSIS — N3090 Cystitis, unspecified without hematuria: Secondary | ICD-10-CM | POA: Diagnosis not present

## 2017-11-30 DIAGNOSIS — C679 Malignant neoplasm of bladder, unspecified: Secondary | ICD-10-CM | POA: Diagnosis not present

## 2017-11-30 NOTE — Telephone Encounter (Signed)
Called to inform patient's daughter to have patient started back on medications per Dr. Agustin Cree. Daughter is concerned and doesn't know if he should even start back the plavix or aspirin due to the hematuria. Will clarify with Dr. Agustin Cree.

## 2017-11-30 NOTE — Telephone Encounter (Signed)
Informed daughter that per Dr. Agustin Cree patient should not start back on either of these medications due to him still bleeding. She will keep was updated and call to let us know how bleeding is. Until bleeding is subsided we will not start back plavix or aspirin. Patient's daughter verbally understands

## 2017-11-30 NOTE — Telephone Encounter (Signed)
Patient gave verbal permission for [place] to talk to his daughter. Patient had surgery on Friday 11/26/17 exploratory bladder for hematuria. He has been off of plavix and aspirin for a little over a week per daughter she is wanting to know when he can start back.

## 2017-12-01 DIAGNOSIS — C61 Malignant neoplasm of prostate: Secondary | ICD-10-CM | POA: Diagnosis not present

## 2017-12-01 DIAGNOSIS — S81811D Laceration without foreign body, right lower leg, subsequent encounter: Secondary | ICD-10-CM | POA: Diagnosis not present

## 2017-12-01 DIAGNOSIS — R339 Retention of urine, unspecified: Secondary | ICD-10-CM | POA: Diagnosis not present

## 2017-12-01 DIAGNOSIS — E119 Type 2 diabetes mellitus without complications: Secondary | ICD-10-CM | POA: Diagnosis not present

## 2017-12-01 DIAGNOSIS — D494 Neoplasm of unspecified behavior of bladder: Secondary | ICD-10-CM | POA: Diagnosis not present

## 2017-12-01 DIAGNOSIS — I10 Essential (primary) hypertension: Secondary | ICD-10-CM | POA: Diagnosis not present

## 2017-12-02 DIAGNOSIS — Z1339 Encounter for screening examination for other mental health and behavioral disorders: Secondary | ICD-10-CM | POA: Diagnosis not present

## 2017-12-02 DIAGNOSIS — I10 Essential (primary) hypertension: Secondary | ICD-10-CM | POA: Diagnosis not present

## 2017-12-02 DIAGNOSIS — Z1331 Encounter for screening for depression: Secondary | ICD-10-CM | POA: Diagnosis not present

## 2017-12-02 DIAGNOSIS — J441 Chronic obstructive pulmonary disease with (acute) exacerbation: Secondary | ICD-10-CM | POA: Diagnosis not present

## 2017-12-02 DIAGNOSIS — E1151 Type 2 diabetes mellitus with diabetic peripheral angiopathy without gangrene: Secondary | ICD-10-CM | POA: Diagnosis not present

## 2017-12-02 DIAGNOSIS — Z6827 Body mass index (BMI) 27.0-27.9, adult: Secondary | ICD-10-CM | POA: Diagnosis not present

## 2017-12-02 DIAGNOSIS — B351 Tinea unguium: Secondary | ICD-10-CM | POA: Diagnosis not present

## 2017-12-02 DIAGNOSIS — I509 Heart failure, unspecified: Secondary | ICD-10-CM | POA: Diagnosis not present

## 2017-12-02 DIAGNOSIS — R600 Localized edema: Secondary | ICD-10-CM | POA: Diagnosis not present

## 2017-12-02 DIAGNOSIS — C679 Malignant neoplasm of bladder, unspecified: Secondary | ICD-10-CM | POA: Diagnosis not present

## 2017-12-03 DIAGNOSIS — I10 Essential (primary) hypertension: Secondary | ICD-10-CM | POA: Diagnosis not present

## 2017-12-03 DIAGNOSIS — S81811D Laceration without foreign body, right lower leg, subsequent encounter: Secondary | ICD-10-CM | POA: Diagnosis not present

## 2017-12-03 DIAGNOSIS — D494 Neoplasm of unspecified behavior of bladder: Secondary | ICD-10-CM | POA: Diagnosis not present

## 2017-12-03 DIAGNOSIS — E119 Type 2 diabetes mellitus without complications: Secondary | ICD-10-CM | POA: Diagnosis not present

## 2017-12-03 DIAGNOSIS — R339 Retention of urine, unspecified: Secondary | ICD-10-CM | POA: Diagnosis not present

## 2017-12-03 DIAGNOSIS — C61 Malignant neoplasm of prostate: Secondary | ICD-10-CM | POA: Diagnosis not present

## 2017-12-06 DIAGNOSIS — D494 Neoplasm of unspecified behavior of bladder: Secondary | ICD-10-CM | POA: Diagnosis not present

## 2017-12-06 DIAGNOSIS — C679 Malignant neoplasm of bladder, unspecified: Secondary | ICD-10-CM | POA: Diagnosis not present

## 2017-12-07 DIAGNOSIS — R339 Retention of urine, unspecified: Secondary | ICD-10-CM | POA: Diagnosis not present

## 2017-12-07 DIAGNOSIS — C61 Malignant neoplasm of prostate: Secondary | ICD-10-CM | POA: Diagnosis not present

## 2017-12-07 DIAGNOSIS — D494 Neoplasm of unspecified behavior of bladder: Secondary | ICD-10-CM | POA: Diagnosis not present

## 2017-12-07 DIAGNOSIS — I10 Essential (primary) hypertension: Secondary | ICD-10-CM | POA: Diagnosis not present

## 2017-12-07 DIAGNOSIS — E119 Type 2 diabetes mellitus without complications: Secondary | ICD-10-CM | POA: Diagnosis not present

## 2017-12-07 DIAGNOSIS — S81811D Laceration without foreign body, right lower leg, subsequent encounter: Secondary | ICD-10-CM | POA: Diagnosis not present

## 2017-12-08 DIAGNOSIS — C61 Malignant neoplasm of prostate: Secondary | ICD-10-CM | POA: Diagnosis not present

## 2017-12-08 DIAGNOSIS — D494 Neoplasm of unspecified behavior of bladder: Secondary | ICD-10-CM | POA: Diagnosis not present

## 2017-12-08 DIAGNOSIS — I10 Essential (primary) hypertension: Secondary | ICD-10-CM | POA: Diagnosis not present

## 2017-12-08 DIAGNOSIS — E119 Type 2 diabetes mellitus without complications: Secondary | ICD-10-CM | POA: Diagnosis not present

## 2017-12-08 DIAGNOSIS — S81811D Laceration without foreign body, right lower leg, subsequent encounter: Secondary | ICD-10-CM | POA: Diagnosis not present

## 2017-12-08 DIAGNOSIS — R339 Retention of urine, unspecified: Secondary | ICD-10-CM | POA: Diagnosis not present

## 2017-12-09 ENCOUNTER — Telehealth: Payer: Self-pay | Admitting: Cardiology

## 2017-12-09 DIAGNOSIS — D494 Neoplasm of unspecified behavior of bladder: Secondary | ICD-10-CM | POA: Diagnosis not present

## 2017-12-09 DIAGNOSIS — I10 Essential (primary) hypertension: Secondary | ICD-10-CM | POA: Diagnosis not present

## 2017-12-09 DIAGNOSIS — E119 Type 2 diabetes mellitus without complications: Secondary | ICD-10-CM | POA: Diagnosis not present

## 2017-12-09 DIAGNOSIS — S81811D Laceration without foreign body, right lower leg, subsequent encounter: Secondary | ICD-10-CM | POA: Diagnosis not present

## 2017-12-09 DIAGNOSIS — R339 Retention of urine, unspecified: Secondary | ICD-10-CM | POA: Diagnosis not present

## 2017-12-09 DIAGNOSIS — C61 Malignant neoplasm of prostate: Secondary | ICD-10-CM | POA: Diagnosis not present

## 2017-12-09 NOTE — Telephone Encounter (Signed)
Left message for patients daughter to return call.

## 2017-12-09 NOTE — Telephone Encounter (Signed)
Patients daughter called and needs to speak to you regarding patient going back on aspirin and plavix.

## 2017-12-10 NOTE — Telephone Encounter (Signed)
Patient's daughter reports that patient was recently diagnosed with bladder cancer. He is no longer visibly bleeding but some blood is still visible in his urine samples he will be following up with his primary care provider about this. Patients urologist started patient back on 81 mg aspirin daily yesterday and daughter is wanting to know if we can start him back on plavix as well. Will confirm with Dr. Agustin Cree.

## 2017-12-10 NOTE — Telephone Encounter (Signed)
Patient's daughter informed to refrain from starting Plavix at this time. We ask that patient monitor for return of bleeding and let us know. Patient's daughter verbally understands

## 2017-12-13 DIAGNOSIS — I10 Essential (primary) hypertension: Secondary | ICD-10-CM | POA: Diagnosis not present

## 2017-12-13 DIAGNOSIS — R339 Retention of urine, unspecified: Secondary | ICD-10-CM | POA: Diagnosis not present

## 2017-12-13 DIAGNOSIS — E119 Type 2 diabetes mellitus without complications: Secondary | ICD-10-CM | POA: Diagnosis not present

## 2017-12-13 DIAGNOSIS — D494 Neoplasm of unspecified behavior of bladder: Secondary | ICD-10-CM | POA: Diagnosis not present

## 2017-12-13 DIAGNOSIS — C61 Malignant neoplasm of prostate: Secondary | ICD-10-CM | POA: Diagnosis not present

## 2017-12-13 DIAGNOSIS — S81811D Laceration without foreign body, right lower leg, subsequent encounter: Secondary | ICD-10-CM | POA: Diagnosis not present

## 2017-12-14 ENCOUNTER — Other Ambulatory Visit: Payer: Self-pay

## 2017-12-15 DIAGNOSIS — S81811D Laceration without foreign body, right lower leg, subsequent encounter: Secondary | ICD-10-CM | POA: Diagnosis not present

## 2017-12-15 DIAGNOSIS — C61 Malignant neoplasm of prostate: Secondary | ICD-10-CM | POA: Diagnosis not present

## 2017-12-15 DIAGNOSIS — R339 Retention of urine, unspecified: Secondary | ICD-10-CM | POA: Diagnosis not present

## 2017-12-15 DIAGNOSIS — E119 Type 2 diabetes mellitus without complications: Secondary | ICD-10-CM | POA: Diagnosis not present

## 2017-12-15 DIAGNOSIS — D494 Neoplasm of unspecified behavior of bladder: Secondary | ICD-10-CM | POA: Diagnosis not present

## 2017-12-15 DIAGNOSIS — I10 Essential (primary) hypertension: Secondary | ICD-10-CM | POA: Diagnosis not present

## 2017-12-17 DIAGNOSIS — I10 Essential (primary) hypertension: Secondary | ICD-10-CM | POA: Diagnosis not present

## 2017-12-17 DIAGNOSIS — E119 Type 2 diabetes mellitus without complications: Secondary | ICD-10-CM | POA: Diagnosis not present

## 2017-12-17 DIAGNOSIS — D494 Neoplasm of unspecified behavior of bladder: Secondary | ICD-10-CM | POA: Diagnosis not present

## 2017-12-17 DIAGNOSIS — C61 Malignant neoplasm of prostate: Secondary | ICD-10-CM | POA: Diagnosis not present

## 2017-12-17 DIAGNOSIS — R339 Retention of urine, unspecified: Secondary | ICD-10-CM | POA: Diagnosis not present

## 2017-12-17 DIAGNOSIS — S81811D Laceration without foreign body, right lower leg, subsequent encounter: Secondary | ICD-10-CM | POA: Diagnosis not present

## 2017-12-18 DIAGNOSIS — K591 Functional diarrhea: Secondary | ICD-10-CM | POA: Diagnosis not present

## 2017-12-18 DIAGNOSIS — N3091 Cystitis, unspecified with hematuria: Secondary | ICD-10-CM | POA: Diagnosis not present

## 2017-12-18 DIAGNOSIS — S91001A Unspecified open wound, right ankle, initial encounter: Secondary | ICD-10-CM | POA: Diagnosis not present

## 2017-12-18 DIAGNOSIS — R300 Dysuria: Secondary | ICD-10-CM | POA: Diagnosis not present

## 2017-12-20 DIAGNOSIS — R5381 Other malaise: Secondary | ICD-10-CM | POA: Diagnosis not present

## 2017-12-20 DIAGNOSIS — I252 Old myocardial infarction: Secondary | ICD-10-CM | POA: Diagnosis not present

## 2017-12-20 DIAGNOSIS — J449 Chronic obstructive pulmonary disease, unspecified: Secondary | ICD-10-CM | POA: Diagnosis not present

## 2017-12-20 DIAGNOSIS — N3000 Acute cystitis without hematuria: Secondary | ICD-10-CM | POA: Diagnosis not present

## 2017-12-20 DIAGNOSIS — I10 Essential (primary) hypertension: Secondary | ICD-10-CM | POA: Diagnosis not present

## 2017-12-20 DIAGNOSIS — R531 Weakness: Secondary | ICD-10-CM | POA: Diagnosis not present

## 2017-12-20 DIAGNOSIS — R197 Diarrhea, unspecified: Secondary | ICD-10-CM | POA: Diagnosis not present

## 2017-12-20 DIAGNOSIS — J439 Emphysema, unspecified: Secondary | ICD-10-CM | POA: Diagnosis not present

## 2017-12-20 DIAGNOSIS — Z743 Need for continuous supervision: Secondary | ICD-10-CM | POA: Diagnosis not present

## 2017-12-20 DIAGNOSIS — B9689 Other specified bacterial agents as the cause of diseases classified elsewhere: Secondary | ICD-10-CM | POA: Diagnosis not present

## 2017-12-20 DIAGNOSIS — N289 Disorder of kidney and ureter, unspecified: Secondary | ICD-10-CM | POA: Diagnosis not present

## 2017-12-20 DIAGNOSIS — E860 Dehydration: Secondary | ICD-10-CM | POA: Diagnosis not present

## 2017-12-21 DIAGNOSIS — Z8546 Personal history of malignant neoplasm of prostate: Secondary | ICD-10-CM | POA: Diagnosis not present

## 2017-12-21 DIAGNOSIS — D509 Iron deficiency anemia, unspecified: Secondary | ICD-10-CM | POA: Diagnosis not present

## 2017-12-21 DIAGNOSIS — N181 Chronic kidney disease, stage 1: Secondary | ICD-10-CM | POA: Diagnosis not present

## 2017-12-21 DIAGNOSIS — Z9079 Acquired absence of other genital organ(s): Secondary | ICD-10-CM | POA: Diagnosis not present

## 2017-12-21 DIAGNOSIS — I519 Heart disease, unspecified: Secondary | ICD-10-CM | POA: Diagnosis not present

## 2017-12-21 DIAGNOSIS — I119 Hypertensive heart disease without heart failure: Secondary | ICD-10-CM | POA: Diagnosis not present

## 2017-12-21 DIAGNOSIS — C679 Malignant neoplasm of bladder, unspecified: Secondary | ICD-10-CM | POA: Diagnosis not present

## 2017-12-21 DIAGNOSIS — Z950 Presence of cardiac pacemaker: Secondary | ICD-10-CM | POA: Diagnosis not present

## 2017-12-21 DIAGNOSIS — D510 Vitamin B12 deficiency anemia due to intrinsic factor deficiency: Secondary | ICD-10-CM | POA: Diagnosis not present

## 2017-12-21 DIAGNOSIS — M1990 Unspecified osteoarthritis, unspecified site: Secondary | ICD-10-CM | POA: Diagnosis not present

## 2017-12-21 DIAGNOSIS — E1151 Type 2 diabetes mellitus with diabetic peripheral angiopathy without gangrene: Secondary | ICD-10-CM | POA: Diagnosis not present

## 2017-12-21 DIAGNOSIS — M8580 Other specified disorders of bone density and structure, unspecified site: Secondary | ICD-10-CM | POA: Diagnosis not present

## 2017-12-21 DIAGNOSIS — Z87891 Personal history of nicotine dependence: Secondary | ICD-10-CM | POA: Diagnosis not present

## 2017-12-21 DIAGNOSIS — E1143 Type 2 diabetes mellitus with diabetic autonomic (poly)neuropathy: Secondary | ICD-10-CM | POA: Diagnosis not present

## 2017-12-22 DIAGNOSIS — I519 Heart disease, unspecified: Secondary | ICD-10-CM | POA: Diagnosis not present

## 2017-12-22 DIAGNOSIS — D510 Vitamin B12 deficiency anemia due to intrinsic factor deficiency: Secondary | ICD-10-CM | POA: Diagnosis not present

## 2017-12-22 DIAGNOSIS — D509 Iron deficiency anemia, unspecified: Secondary | ICD-10-CM | POA: Diagnosis not present

## 2017-12-22 DIAGNOSIS — Z950 Presence of cardiac pacemaker: Secondary | ICD-10-CM | POA: Diagnosis not present

## 2017-12-22 DIAGNOSIS — C679 Malignant neoplasm of bladder, unspecified: Secondary | ICD-10-CM | POA: Diagnosis not present

## 2017-12-22 DIAGNOSIS — I119 Hypertensive heart disease without heart failure: Secondary | ICD-10-CM | POA: Diagnosis not present

## 2017-12-26 DIAGNOSIS — D510 Vitamin B12 deficiency anemia due to intrinsic factor deficiency: Secondary | ICD-10-CM | POA: Diagnosis not present

## 2017-12-26 DIAGNOSIS — E1151 Type 2 diabetes mellitus with diabetic peripheral angiopathy without gangrene: Secondary | ICD-10-CM | POA: Diagnosis not present

## 2017-12-26 DIAGNOSIS — M8580 Other specified disorders of bone density and structure, unspecified site: Secondary | ICD-10-CM | POA: Diagnosis not present

## 2017-12-26 DIAGNOSIS — I119 Hypertensive heart disease without heart failure: Secondary | ICD-10-CM | POA: Diagnosis not present

## 2017-12-26 DIAGNOSIS — Z9079 Acquired absence of other genital organ(s): Secondary | ICD-10-CM | POA: Diagnosis not present

## 2017-12-26 DIAGNOSIS — N320 Bladder-neck obstruction: Secondary | ICD-10-CM | POA: Diagnosis not present

## 2017-12-26 DIAGNOSIS — M1990 Unspecified osteoarthritis, unspecified site: Secondary | ICD-10-CM | POA: Diagnosis not present

## 2017-12-26 DIAGNOSIS — I519 Heart disease, unspecified: Secondary | ICD-10-CM | POA: Diagnosis not present

## 2017-12-26 DIAGNOSIS — Z8546 Personal history of malignant neoplasm of prostate: Secondary | ICD-10-CM | POA: Diagnosis not present

## 2017-12-26 DIAGNOSIS — D509 Iron deficiency anemia, unspecified: Secondary | ICD-10-CM | POA: Diagnosis not present

## 2017-12-26 DIAGNOSIS — Z87891 Personal history of nicotine dependence: Secondary | ICD-10-CM | POA: Diagnosis not present

## 2017-12-26 DIAGNOSIS — Z950 Presence of cardiac pacemaker: Secondary | ICD-10-CM | POA: Diagnosis not present

## 2017-12-26 DIAGNOSIS — T83511A Infection and inflammatory reaction due to indwelling urethral catheter, initial encounter: Secondary | ICD-10-CM | POA: Diagnosis not present

## 2017-12-26 DIAGNOSIS — E1143 Type 2 diabetes mellitus with diabetic autonomic (poly)neuropathy: Secondary | ICD-10-CM | POA: Diagnosis not present

## 2017-12-26 DIAGNOSIS — N181 Chronic kidney disease, stage 1: Secondary | ICD-10-CM | POA: Diagnosis not present

## 2017-12-26 DIAGNOSIS — C679 Malignant neoplasm of bladder, unspecified: Secondary | ICD-10-CM | POA: Diagnosis not present

## 2017-12-27 DIAGNOSIS — I519 Heart disease, unspecified: Secondary | ICD-10-CM | POA: Diagnosis not present

## 2017-12-27 DIAGNOSIS — D509 Iron deficiency anemia, unspecified: Secondary | ICD-10-CM | POA: Diagnosis not present

## 2017-12-27 DIAGNOSIS — I119 Hypertensive heart disease without heart failure: Secondary | ICD-10-CM | POA: Diagnosis not present

## 2017-12-27 DIAGNOSIS — D510 Vitamin B12 deficiency anemia due to intrinsic factor deficiency: Secondary | ICD-10-CM | POA: Diagnosis not present

## 2017-12-27 DIAGNOSIS — C679 Malignant neoplasm of bladder, unspecified: Secondary | ICD-10-CM | POA: Diagnosis not present

## 2017-12-27 DIAGNOSIS — Z950 Presence of cardiac pacemaker: Secondary | ICD-10-CM | POA: Diagnosis not present

## 2017-12-29 DIAGNOSIS — Z950 Presence of cardiac pacemaker: Secondary | ICD-10-CM | POA: Diagnosis not present

## 2017-12-29 DIAGNOSIS — I119 Hypertensive heart disease without heart failure: Secondary | ICD-10-CM | POA: Diagnosis not present

## 2017-12-29 DIAGNOSIS — D509 Iron deficiency anemia, unspecified: Secondary | ICD-10-CM | POA: Diagnosis not present

## 2017-12-29 DIAGNOSIS — D510 Vitamin B12 deficiency anemia due to intrinsic factor deficiency: Secondary | ICD-10-CM | POA: Diagnosis not present

## 2017-12-29 DIAGNOSIS — C679 Malignant neoplasm of bladder, unspecified: Secondary | ICD-10-CM | POA: Diagnosis not present

## 2017-12-29 DIAGNOSIS — I519 Heart disease, unspecified: Secondary | ICD-10-CM | POA: Diagnosis not present

## 2017-12-31 ENCOUNTER — Telehealth: Payer: Self-pay | Admitting: *Deleted

## 2017-12-31 DIAGNOSIS — I519 Heart disease, unspecified: Secondary | ICD-10-CM | POA: Diagnosis not present

## 2017-12-31 DIAGNOSIS — Z950 Presence of cardiac pacemaker: Secondary | ICD-10-CM | POA: Diagnosis not present

## 2017-12-31 DIAGNOSIS — D509 Iron deficiency anemia, unspecified: Secondary | ICD-10-CM | POA: Diagnosis not present

## 2017-12-31 DIAGNOSIS — C679 Malignant neoplasm of bladder, unspecified: Secondary | ICD-10-CM | POA: Diagnosis not present

## 2017-12-31 DIAGNOSIS — I119 Hypertensive heart disease without heart failure: Secondary | ICD-10-CM | POA: Diagnosis not present

## 2017-12-31 DIAGNOSIS — D510 Vitamin B12 deficiency anemia due to intrinsic factor deficiency: Secondary | ICD-10-CM | POA: Diagnosis not present

## 2017-12-31 NOTE — Telephone Encounter (Signed)
Pt needs appt for 5 month f/u. Pt asks [place] to call his daughter Lattie Haw to make this appt. She is an emergency contact, left message on her phone to call us back to schedule that appt.

## 2018-01-02 DIAGNOSIS — Z950 Presence of cardiac pacemaker: Secondary | ICD-10-CM | POA: Diagnosis not present

## 2018-01-02 DIAGNOSIS — D510 Vitamin B12 deficiency anemia due to intrinsic factor deficiency: Secondary | ICD-10-CM | POA: Diagnosis not present

## 2018-01-02 DIAGNOSIS — C679 Malignant neoplasm of bladder, unspecified: Secondary | ICD-10-CM | POA: Diagnosis not present

## 2018-01-02 DIAGNOSIS — I119 Hypertensive heart disease without heart failure: Secondary | ICD-10-CM | POA: Diagnosis not present

## 2018-01-02 DIAGNOSIS — D509 Iron deficiency anemia, unspecified: Secondary | ICD-10-CM | POA: Diagnosis not present

## 2018-01-02 DIAGNOSIS — I519 Heart disease, unspecified: Secondary | ICD-10-CM | POA: Diagnosis not present

## 2018-01-03 ENCOUNTER — Telehealth: Payer: Self-pay | Admitting: Cardiology

## 2018-01-03 DIAGNOSIS — I519 Heart disease, unspecified: Secondary | ICD-10-CM | POA: Diagnosis not present

## 2018-01-03 DIAGNOSIS — I10 Essential (primary) hypertension: Secondary | ICD-10-CM

## 2018-01-03 DIAGNOSIS — I119 Hypertensive heart disease without heart failure: Secondary | ICD-10-CM | POA: Diagnosis not present

## 2018-01-03 DIAGNOSIS — C679 Malignant neoplasm of bladder, unspecified: Secondary | ICD-10-CM | POA: Diagnosis not present

## 2018-01-03 DIAGNOSIS — D510 Vitamin B12 deficiency anemia due to intrinsic factor deficiency: Secondary | ICD-10-CM | POA: Diagnosis not present

## 2018-01-03 DIAGNOSIS — Z950 Presence of cardiac pacemaker: Secondary | ICD-10-CM | POA: Diagnosis not present

## 2018-01-03 DIAGNOSIS — D509 Iron deficiency anemia, unspecified: Secondary | ICD-10-CM | POA: Diagnosis not present

## 2018-01-03 NOTE — Telephone Encounter (Signed)
Please call regarding patient

## 2018-01-03 NOTE — Telephone Encounter (Signed)
Pharmacy reports patient has had a recent new [facility] from [facility] for oxybutynin 5 mg three times daily. There is an interaction that has came up since he takes potassium 10 meq daily. These two together can cause stomach ulcers. Pharmacy would just like recommendations if potassium needs to be continued. Please advise.

## 2018-01-03 NOTE — Telephone Encounter (Signed)
You can check with Dr Raliegh Ip tomorrow

## 2018-01-04 NOTE — Telephone Encounter (Signed)
Patient's caregiver just called back they will bring patient for lab work tomorrow.

## 2018-01-04 NOTE — Telephone Encounter (Signed)
Called patient's daughter to inform of the need for lab work. She reports the patient is on hospice care now after recently being diagnosed with bladder cancer. She will relay  this information to hospice care since they are caring for him at this time. She will call us if she needs anything further.

## 2018-01-04 NOTE — Telephone Encounter (Signed)
Yes, cont potassium for now  chem7 needs to be checked

## 2018-01-05 ENCOUNTER — Telehealth: Payer: Self-pay | Admitting: Emergency Medicine

## 2018-01-05 DIAGNOSIS — D509 Iron deficiency anemia, unspecified: Secondary | ICD-10-CM | POA: Diagnosis not present

## 2018-01-05 DIAGNOSIS — I119 Hypertensive heart disease without heart failure: Secondary | ICD-10-CM | POA: Diagnosis not present

## 2018-01-05 DIAGNOSIS — C679 Malignant neoplasm of bladder, unspecified: Secondary | ICD-10-CM | POA: Diagnosis not present

## 2018-01-05 DIAGNOSIS — I10 Essential (primary) hypertension: Secondary | ICD-10-CM | POA: Diagnosis not present

## 2018-01-05 DIAGNOSIS — D510 Vitamin B12 deficiency anemia due to intrinsic factor deficiency: Secondary | ICD-10-CM | POA: Diagnosis not present

## 2018-01-05 DIAGNOSIS — Z950 Presence of cardiac pacemaker: Secondary | ICD-10-CM | POA: Diagnosis not present

## 2018-01-05 DIAGNOSIS — I519 Heart disease, unspecified: Secondary | ICD-10-CM | POA: Diagnosis not present

## 2018-01-05 NOTE — Telephone Encounter (Signed)
Patient came in office for lab work. His caregiver, Dorothea Ogle, informed me that he has been taken off oxybutnin and has increased his lasix to 80 mg daily. Dr. Agustin Cree aware he still advises lab work.

## 2018-01-06 LAB — BASIC METABOLIC PANEL
BUN / CREAT RATIO: 14 (ref 10–24)
BUN: 17 mg/dL (ref 8–27)
CALCIUM: 9.5 mg/dL (ref 8.6–10.2)
CHLORIDE: 97 mmol/L (ref 96–106)
CO2: 23 mmol/L (ref 20–29)
Creatinine, Ser: 1.18 mg/dL (ref 0.76–1.27)
GFR calc Af Amer: 63 mL/min/{1.73_m2} (ref >59)
GFR calc non Af Amer: 55 mL/min/{1.73_m2} — ABNORMAL LOW (ref >59)
Glucose: 140 mg/dL — ABNORMAL HIGH (ref 65–99)
POTASSIUM: 3.9 mmol/L (ref 3.5–5.2)
Sodium: 136 mmol/L (ref 134–144)

## 2018-01-07 DIAGNOSIS — D509 Iron deficiency anemia, unspecified: Secondary | ICD-10-CM | POA: Diagnosis not present

## 2018-01-07 DIAGNOSIS — I119 Hypertensive heart disease without heart failure: Secondary | ICD-10-CM | POA: Diagnosis not present

## 2018-01-07 DIAGNOSIS — Z950 Presence of cardiac pacemaker: Secondary | ICD-10-CM | POA: Diagnosis not present

## 2018-01-07 DIAGNOSIS — D510 Vitamin B12 deficiency anemia due to intrinsic factor deficiency: Secondary | ICD-10-CM | POA: Diagnosis not present

## 2018-01-07 DIAGNOSIS — C679 Malignant neoplasm of bladder, unspecified: Secondary | ICD-10-CM | POA: Diagnosis not present

## 2018-01-07 DIAGNOSIS — I519 Heart disease, unspecified: Secondary | ICD-10-CM | POA: Diagnosis not present

## 2018-01-08 DIAGNOSIS — C679 Malignant neoplasm of bladder, unspecified: Secondary | ICD-10-CM | POA: Diagnosis not present

## 2018-01-08 DIAGNOSIS — I119 Hypertensive heart disease without heart failure: Secondary | ICD-10-CM | POA: Diagnosis not present

## 2018-01-08 DIAGNOSIS — D510 Vitamin B12 deficiency anemia due to intrinsic factor deficiency: Secondary | ICD-10-CM | POA: Diagnosis not present

## 2018-01-08 DIAGNOSIS — Z950 Presence of cardiac pacemaker: Secondary | ICD-10-CM | POA: Diagnosis not present

## 2018-01-08 DIAGNOSIS — I519 Heart disease, unspecified: Secondary | ICD-10-CM | POA: Diagnosis not present

## 2018-01-08 DIAGNOSIS — D509 Iron deficiency anemia, unspecified: Secondary | ICD-10-CM | POA: Diagnosis not present

## 2018-01-09 DIAGNOSIS — C679 Malignant neoplasm of bladder, unspecified: Secondary | ICD-10-CM | POA: Diagnosis not present

## 2018-01-09 DIAGNOSIS — D510 Vitamin B12 deficiency anemia due to intrinsic factor deficiency: Secondary | ICD-10-CM | POA: Diagnosis not present

## 2018-01-09 DIAGNOSIS — T83511A Infection and inflammatory reaction due to indwelling urethral catheter, initial encounter: Secondary | ICD-10-CM | POA: Diagnosis not present

## 2018-01-09 DIAGNOSIS — D509 Iron deficiency anemia, unspecified: Secondary | ICD-10-CM | POA: Diagnosis not present

## 2018-01-09 DIAGNOSIS — I519 Heart disease, unspecified: Secondary | ICD-10-CM | POA: Diagnosis not present

## 2018-01-09 DIAGNOSIS — I119 Hypertensive heart disease without heart failure: Secondary | ICD-10-CM | POA: Diagnosis not present

## 2018-01-09 DIAGNOSIS — Z950 Presence of cardiac pacemaker: Secondary | ICD-10-CM | POA: Diagnosis not present

## 2018-01-09 DIAGNOSIS — N320 Bladder-neck obstruction: Secondary | ICD-10-CM | POA: Diagnosis not present

## 2018-01-09 DIAGNOSIS — R0902 Hypoxemia: Secondary | ICD-10-CM | POA: Diagnosis not present

## 2018-01-10 DIAGNOSIS — I119 Hypertensive heart disease without heart failure: Secondary | ICD-10-CM | POA: Diagnosis not present

## 2018-01-10 DIAGNOSIS — C679 Malignant neoplasm of bladder, unspecified: Secondary | ICD-10-CM | POA: Diagnosis not present

## 2018-01-10 DIAGNOSIS — D510 Vitamin B12 deficiency anemia due to intrinsic factor deficiency: Secondary | ICD-10-CM | POA: Diagnosis not present

## 2018-01-10 DIAGNOSIS — D509 Iron deficiency anemia, unspecified: Secondary | ICD-10-CM | POA: Diagnosis not present

## 2018-01-10 DIAGNOSIS — I519 Heart disease, unspecified: Secondary | ICD-10-CM | POA: Diagnosis not present

## 2018-01-10 DIAGNOSIS — Z950 Presence of cardiac pacemaker: Secondary | ICD-10-CM | POA: Diagnosis not present

## 2018-01-12 DIAGNOSIS — C679 Malignant neoplasm of bladder, unspecified: Secondary | ICD-10-CM | POA: Diagnosis not present

## 2018-01-12 DIAGNOSIS — D510 Vitamin B12 deficiency anemia due to intrinsic factor deficiency: Secondary | ICD-10-CM | POA: Diagnosis not present

## 2018-01-12 DIAGNOSIS — D509 Iron deficiency anemia, unspecified: Secondary | ICD-10-CM | POA: Diagnosis not present

## 2018-01-12 DIAGNOSIS — I519 Heart disease, unspecified: Secondary | ICD-10-CM | POA: Diagnosis not present

## 2018-01-12 DIAGNOSIS — I119 Hypertensive heart disease without heart failure: Secondary | ICD-10-CM | POA: Diagnosis not present

## 2018-01-12 DIAGNOSIS — Z950 Presence of cardiac pacemaker: Secondary | ICD-10-CM | POA: Diagnosis not present

## 2018-01-13 ENCOUNTER — Other Ambulatory Visit: Payer: Self-pay | Admitting: Cardiology

## 2018-01-14 DIAGNOSIS — C679 Malignant neoplasm of bladder, unspecified: Secondary | ICD-10-CM | POA: Diagnosis not present

## 2018-01-14 DIAGNOSIS — Z950 Presence of cardiac pacemaker: Secondary | ICD-10-CM | POA: Diagnosis not present

## 2018-01-14 DIAGNOSIS — D509 Iron deficiency anemia, unspecified: Secondary | ICD-10-CM | POA: Diagnosis not present

## 2018-01-14 DIAGNOSIS — D510 Vitamin B12 deficiency anemia due to intrinsic factor deficiency: Secondary | ICD-10-CM | POA: Diagnosis not present

## 2018-01-14 DIAGNOSIS — I119 Hypertensive heart disease without heart failure: Secondary | ICD-10-CM | POA: Diagnosis not present

## 2018-01-14 DIAGNOSIS — I519 Heart disease, unspecified: Secondary | ICD-10-CM | POA: Diagnosis not present

## 2018-01-16 DIAGNOSIS — D510 Vitamin B12 deficiency anemia due to intrinsic factor deficiency: Secondary | ICD-10-CM | POA: Diagnosis not present

## 2018-01-16 DIAGNOSIS — I519 Heart disease, unspecified: Secondary | ICD-10-CM | POA: Diagnosis not present

## 2018-01-16 DIAGNOSIS — I119 Hypertensive heart disease without heart failure: Secondary | ICD-10-CM | POA: Diagnosis not present

## 2018-01-16 DIAGNOSIS — Z950 Presence of cardiac pacemaker: Secondary | ICD-10-CM | POA: Diagnosis not present

## 2018-01-16 DIAGNOSIS — C679 Malignant neoplasm of bladder, unspecified: Secondary | ICD-10-CM | POA: Diagnosis not present

## 2018-01-16 DIAGNOSIS — D509 Iron deficiency anemia, unspecified: Secondary | ICD-10-CM | POA: Diagnosis not present

## 2018-01-18 DIAGNOSIS — D509 Iron deficiency anemia, unspecified: Secondary | ICD-10-CM | POA: Diagnosis not present

## 2018-01-18 DIAGNOSIS — D510 Vitamin B12 deficiency anemia due to intrinsic factor deficiency: Secondary | ICD-10-CM | POA: Diagnosis not present

## 2018-01-18 DIAGNOSIS — I119 Hypertensive heart disease without heart failure: Secondary | ICD-10-CM | POA: Diagnosis not present

## 2018-01-18 DIAGNOSIS — C679 Malignant neoplasm of bladder, unspecified: Secondary | ICD-10-CM | POA: Diagnosis not present

## 2018-01-18 DIAGNOSIS — I519 Heart disease, unspecified: Secondary | ICD-10-CM | POA: Diagnosis not present

## 2018-01-18 DIAGNOSIS — Z950 Presence of cardiac pacemaker: Secondary | ICD-10-CM | POA: Diagnosis not present

## 2018-01-19 DIAGNOSIS — I519 Heart disease, unspecified: Secondary | ICD-10-CM | POA: Diagnosis not present

## 2018-01-19 DIAGNOSIS — D510 Vitamin B12 deficiency anemia due to intrinsic factor deficiency: Secondary | ICD-10-CM | POA: Diagnosis not present

## 2018-01-19 DIAGNOSIS — I119 Hypertensive heart disease without heart failure: Secondary | ICD-10-CM | POA: Diagnosis not present

## 2018-01-19 DIAGNOSIS — Z950 Presence of cardiac pacemaker: Secondary | ICD-10-CM | POA: Diagnosis not present

## 2018-01-19 DIAGNOSIS — C679 Malignant neoplasm of bladder, unspecified: Secondary | ICD-10-CM | POA: Diagnosis not present

## 2018-01-19 DIAGNOSIS — D509 Iron deficiency anemia, unspecified: Secondary | ICD-10-CM | POA: Diagnosis not present

## 2018-01-21 DIAGNOSIS — Z950 Presence of cardiac pacemaker: Secondary | ICD-10-CM | POA: Diagnosis not present

## 2018-01-21 DIAGNOSIS — D509 Iron deficiency anemia, unspecified: Secondary | ICD-10-CM | POA: Diagnosis not present

## 2018-01-21 DIAGNOSIS — C679 Malignant neoplasm of bladder, unspecified: Secondary | ICD-10-CM | POA: Diagnosis not present

## 2018-01-21 DIAGNOSIS — I119 Hypertensive heart disease without heart failure: Secondary | ICD-10-CM | POA: Diagnosis not present

## 2018-01-21 DIAGNOSIS — I519 Heart disease, unspecified: Secondary | ICD-10-CM | POA: Diagnosis not present

## 2018-01-21 DIAGNOSIS — D510 Vitamin B12 deficiency anemia due to intrinsic factor deficiency: Secondary | ICD-10-CM | POA: Diagnosis not present

## 2018-01-24 DIAGNOSIS — Z950 Presence of cardiac pacemaker: Secondary | ICD-10-CM | POA: Diagnosis not present

## 2018-01-24 DIAGNOSIS — I119 Hypertensive heart disease without heart failure: Secondary | ICD-10-CM | POA: Diagnosis not present

## 2018-01-24 DIAGNOSIS — D509 Iron deficiency anemia, unspecified: Secondary | ICD-10-CM | POA: Diagnosis not present

## 2018-01-24 DIAGNOSIS — D510 Vitamin B12 deficiency anemia due to intrinsic factor deficiency: Secondary | ICD-10-CM | POA: Diagnosis not present

## 2018-01-24 DIAGNOSIS — C679 Malignant neoplasm of bladder, unspecified: Secondary | ICD-10-CM | POA: Diagnosis not present

## 2018-01-24 DIAGNOSIS — I519 Heart disease, unspecified: Secondary | ICD-10-CM | POA: Diagnosis not present

## 2018-01-25 DIAGNOSIS — I119 Hypertensive heart disease without heart failure: Secondary | ICD-10-CM | POA: Diagnosis not present

## 2018-01-25 DIAGNOSIS — I519 Heart disease, unspecified: Secondary | ICD-10-CM | POA: Diagnosis not present

## 2018-01-25 DIAGNOSIS — C679 Malignant neoplasm of bladder, unspecified: Secondary | ICD-10-CM | POA: Diagnosis not present

## 2018-01-25 DIAGNOSIS — Z950 Presence of cardiac pacemaker: Secondary | ICD-10-CM | POA: Diagnosis not present

## 2018-01-25 DIAGNOSIS — D509 Iron deficiency anemia, unspecified: Secondary | ICD-10-CM | POA: Diagnosis not present

## 2018-01-25 DIAGNOSIS — D510 Vitamin B12 deficiency anemia due to intrinsic factor deficiency: Secondary | ICD-10-CM | POA: Diagnosis not present

## 2018-01-26 DIAGNOSIS — Z950 Presence of cardiac pacemaker: Secondary | ICD-10-CM | POA: Diagnosis not present

## 2018-01-26 DIAGNOSIS — Z9079 Acquired absence of other genital organ(s): Secondary | ICD-10-CM | POA: Diagnosis not present

## 2018-01-26 DIAGNOSIS — M8580 Other specified disorders of bone density and structure, unspecified site: Secondary | ICD-10-CM | POA: Diagnosis not present

## 2018-01-26 DIAGNOSIS — E1143 Type 2 diabetes mellitus with diabetic autonomic (poly)neuropathy: Secondary | ICD-10-CM | POA: Diagnosis not present

## 2018-01-26 DIAGNOSIS — D510 Vitamin B12 deficiency anemia due to intrinsic factor deficiency: Secondary | ICD-10-CM | POA: Diagnosis not present

## 2018-01-26 DIAGNOSIS — D509 Iron deficiency anemia, unspecified: Secondary | ICD-10-CM | POA: Diagnosis not present

## 2018-01-26 DIAGNOSIS — I119 Hypertensive heart disease without heart failure: Secondary | ICD-10-CM | POA: Diagnosis not present

## 2018-01-26 DIAGNOSIS — M1990 Unspecified osteoarthritis, unspecified site: Secondary | ICD-10-CM | POA: Diagnosis not present

## 2018-01-26 DIAGNOSIS — C679 Malignant neoplasm of bladder, unspecified: Secondary | ICD-10-CM | POA: Diagnosis not present

## 2018-01-26 DIAGNOSIS — N181 Chronic kidney disease, stage 1: Secondary | ICD-10-CM | POA: Diagnosis not present

## 2018-01-26 DIAGNOSIS — I519 Heart disease, unspecified: Secondary | ICD-10-CM | POA: Diagnosis not present

## 2018-01-26 DIAGNOSIS — E1151 Type 2 diabetes mellitus with diabetic peripheral angiopathy without gangrene: Secondary | ICD-10-CM | POA: Diagnosis not present

## 2018-01-26 DIAGNOSIS — Z8546 Personal history of malignant neoplasm of prostate: Secondary | ICD-10-CM | POA: Diagnosis not present

## 2018-01-26 DIAGNOSIS — Z87891 Personal history of nicotine dependence: Secondary | ICD-10-CM | POA: Diagnosis not present

## 2018-01-27 DIAGNOSIS — I119 Hypertensive heart disease without heart failure: Secondary | ICD-10-CM | POA: Diagnosis not present

## 2018-01-27 DIAGNOSIS — D509 Iron deficiency anemia, unspecified: Secondary | ICD-10-CM | POA: Diagnosis not present

## 2018-01-27 DIAGNOSIS — I519 Heart disease, unspecified: Secondary | ICD-10-CM | POA: Diagnosis not present

## 2018-01-27 DIAGNOSIS — C679 Malignant neoplasm of bladder, unspecified: Secondary | ICD-10-CM | POA: Diagnosis not present

## 2018-01-27 DIAGNOSIS — D510 Vitamin B12 deficiency anemia due to intrinsic factor deficiency: Secondary | ICD-10-CM | POA: Diagnosis not present

## 2018-01-27 DIAGNOSIS — Z950 Presence of cardiac pacemaker: Secondary | ICD-10-CM | POA: Diagnosis not present

## 2018-01-28 DIAGNOSIS — C679 Malignant neoplasm of bladder, unspecified: Secondary | ICD-10-CM | POA: Diagnosis not present

## 2018-01-28 DIAGNOSIS — I519 Heart disease, unspecified: Secondary | ICD-10-CM | POA: Diagnosis not present

## 2018-01-28 DIAGNOSIS — Z950 Presence of cardiac pacemaker: Secondary | ICD-10-CM | POA: Diagnosis not present

## 2018-01-28 DIAGNOSIS — D510 Vitamin B12 deficiency anemia due to intrinsic factor deficiency: Secondary | ICD-10-CM | POA: Diagnosis not present

## 2018-01-28 DIAGNOSIS — I119 Hypertensive heart disease without heart failure: Secondary | ICD-10-CM | POA: Diagnosis not present

## 2018-01-28 DIAGNOSIS — D509 Iron deficiency anemia, unspecified: Secondary | ICD-10-CM | POA: Diagnosis not present

## 2018-01-31 DIAGNOSIS — Z950 Presence of cardiac pacemaker: Secondary | ICD-10-CM | POA: Diagnosis not present

## 2018-02-01 DIAGNOSIS — C679 Malignant neoplasm of bladder, unspecified: Secondary | ICD-10-CM | POA: Diagnosis not present

## 2018-02-01 DIAGNOSIS — Z950 Presence of cardiac pacemaker: Secondary | ICD-10-CM | POA: Diagnosis not present

## 2018-02-01 DIAGNOSIS — D510 Vitamin B12 deficiency anemia due to intrinsic factor deficiency: Secondary | ICD-10-CM | POA: Diagnosis not present

## 2018-02-01 DIAGNOSIS — I519 Heart disease, unspecified: Secondary | ICD-10-CM | POA: Diagnosis not present

## 2018-02-01 DIAGNOSIS — I119 Hypertensive heart disease without heart failure: Secondary | ICD-10-CM | POA: Diagnosis not present

## 2018-02-01 DIAGNOSIS — D509 Iron deficiency anemia, unspecified: Secondary | ICD-10-CM | POA: Diagnosis not present

## 2018-02-04 DIAGNOSIS — I119 Hypertensive heart disease without heart failure: Secondary | ICD-10-CM | POA: Diagnosis not present

## 2018-02-04 DIAGNOSIS — C679 Malignant neoplasm of bladder, unspecified: Secondary | ICD-10-CM | POA: Diagnosis not present

## 2018-02-04 DIAGNOSIS — D510 Vitamin B12 deficiency anemia due to intrinsic factor deficiency: Secondary | ICD-10-CM | POA: Diagnosis not present

## 2018-02-04 DIAGNOSIS — I519 Heart disease, unspecified: Secondary | ICD-10-CM | POA: Diagnosis not present

## 2018-02-04 DIAGNOSIS — Z950 Presence of cardiac pacemaker: Secondary | ICD-10-CM | POA: Diagnosis not present

## 2018-02-04 DIAGNOSIS — D509 Iron deficiency anemia, unspecified: Secondary | ICD-10-CM | POA: Diagnosis not present

## 2018-02-07 DIAGNOSIS — I119 Hypertensive heart disease without heart failure: Secondary | ICD-10-CM | POA: Diagnosis not present

## 2018-02-07 DIAGNOSIS — Z950 Presence of cardiac pacemaker: Secondary | ICD-10-CM | POA: Diagnosis not present

## 2018-02-07 DIAGNOSIS — D509 Iron deficiency anemia, unspecified: Secondary | ICD-10-CM | POA: Diagnosis not present

## 2018-02-07 DIAGNOSIS — C679 Malignant neoplasm of bladder, unspecified: Secondary | ICD-10-CM | POA: Diagnosis not present

## 2018-02-07 DIAGNOSIS — D510 Vitamin B12 deficiency anemia due to intrinsic factor deficiency: Secondary | ICD-10-CM | POA: Diagnosis not present

## 2018-02-07 DIAGNOSIS — I519 Heart disease, unspecified: Secondary | ICD-10-CM | POA: Diagnosis not present

## 2018-02-11 DIAGNOSIS — I519 Heart disease, unspecified: Secondary | ICD-10-CM | POA: Diagnosis not present

## 2018-02-11 DIAGNOSIS — I119 Hypertensive heart disease without heart failure: Secondary | ICD-10-CM | POA: Diagnosis not present

## 2018-02-11 DIAGNOSIS — D509 Iron deficiency anemia, unspecified: Secondary | ICD-10-CM | POA: Diagnosis not present

## 2018-02-11 DIAGNOSIS — C679 Malignant neoplasm of bladder, unspecified: Secondary | ICD-10-CM | POA: Diagnosis not present

## 2018-02-11 DIAGNOSIS — Z950 Presence of cardiac pacemaker: Secondary | ICD-10-CM | POA: Diagnosis not present

## 2018-02-11 DIAGNOSIS — D510 Vitamin B12 deficiency anemia due to intrinsic factor deficiency: Secondary | ICD-10-CM | POA: Diagnosis not present

## 2018-02-14 DIAGNOSIS — D510 Vitamin B12 deficiency anemia due to intrinsic factor deficiency: Secondary | ICD-10-CM | POA: Diagnosis not present

## 2018-02-14 DIAGNOSIS — I519 Heart disease, unspecified: Secondary | ICD-10-CM | POA: Diagnosis not present

## 2018-02-14 DIAGNOSIS — Z950 Presence of cardiac pacemaker: Secondary | ICD-10-CM | POA: Diagnosis not present

## 2018-02-14 DIAGNOSIS — D509 Iron deficiency anemia, unspecified: Secondary | ICD-10-CM | POA: Diagnosis not present

## 2018-02-14 DIAGNOSIS — I119 Hypertensive heart disease without heart failure: Secondary | ICD-10-CM | POA: Diagnosis not present

## 2018-02-14 DIAGNOSIS — C679 Malignant neoplasm of bladder, unspecified: Secondary | ICD-10-CM | POA: Diagnosis not present

## 2018-02-18 DIAGNOSIS — D509 Iron deficiency anemia, unspecified: Secondary | ICD-10-CM | POA: Diagnosis not present

## 2018-02-18 DIAGNOSIS — I519 Heart disease, unspecified: Secondary | ICD-10-CM | POA: Diagnosis not present

## 2018-02-18 DIAGNOSIS — D510 Vitamin B12 deficiency anemia due to intrinsic factor deficiency: Secondary | ICD-10-CM | POA: Diagnosis not present

## 2018-02-18 DIAGNOSIS — C679 Malignant neoplasm of bladder, unspecified: Secondary | ICD-10-CM | POA: Diagnosis not present

## 2018-02-18 DIAGNOSIS — Z950 Presence of cardiac pacemaker: Secondary | ICD-10-CM | POA: Diagnosis not present

## 2018-02-18 DIAGNOSIS — I119 Hypertensive heart disease without heart failure: Secondary | ICD-10-CM | POA: Diagnosis not present

## 2018-02-22 DIAGNOSIS — Z950 Presence of cardiac pacemaker: Secondary | ICD-10-CM | POA: Diagnosis not present

## 2018-02-22 DIAGNOSIS — I119 Hypertensive heart disease without heart failure: Secondary | ICD-10-CM | POA: Diagnosis not present

## 2018-02-22 DIAGNOSIS — C679 Malignant neoplasm of bladder, unspecified: Secondary | ICD-10-CM | POA: Diagnosis not present

## 2018-02-22 DIAGNOSIS — D509 Iron deficiency anemia, unspecified: Secondary | ICD-10-CM | POA: Diagnosis not present

## 2018-02-22 DIAGNOSIS — I519 Heart disease, unspecified: Secondary | ICD-10-CM | POA: Diagnosis not present

## 2018-02-22 DIAGNOSIS — D510 Vitamin B12 deficiency anemia due to intrinsic factor deficiency: Secondary | ICD-10-CM | POA: Diagnosis not present

## 2018-02-25 DIAGNOSIS — D510 Vitamin B12 deficiency anemia due to intrinsic factor deficiency: Secondary | ICD-10-CM | POA: Diagnosis not present

## 2018-02-25 DIAGNOSIS — I519 Heart disease, unspecified: Secondary | ICD-10-CM | POA: Diagnosis not present

## 2018-02-25 DIAGNOSIS — D509 Iron deficiency anemia, unspecified: Secondary | ICD-10-CM | POA: Diagnosis not present

## 2018-02-25 DIAGNOSIS — Z950 Presence of cardiac pacemaker: Secondary | ICD-10-CM | POA: Diagnosis not present

## 2018-02-25 DIAGNOSIS — I119 Hypertensive heart disease without heart failure: Secondary | ICD-10-CM | POA: Diagnosis not present

## 2018-02-25 DIAGNOSIS — C679 Malignant neoplasm of bladder, unspecified: Secondary | ICD-10-CM | POA: Diagnosis not present

## 2018-02-26 DIAGNOSIS — Z87891 Personal history of nicotine dependence: Secondary | ICD-10-CM | POA: Diagnosis not present

## 2018-02-26 DIAGNOSIS — I519 Heart disease, unspecified: Secondary | ICD-10-CM | POA: Diagnosis not present

## 2018-02-26 DIAGNOSIS — C679 Malignant neoplasm of bladder, unspecified: Secondary | ICD-10-CM | POA: Diagnosis not present

## 2018-02-26 DIAGNOSIS — I119 Hypertensive heart disease without heart failure: Secondary | ICD-10-CM | POA: Diagnosis not present

## 2018-02-26 DIAGNOSIS — D509 Iron deficiency anemia, unspecified: Secondary | ICD-10-CM | POA: Diagnosis not present

## 2018-02-26 DIAGNOSIS — E1143 Type 2 diabetes mellitus with diabetic autonomic (poly)neuropathy: Secondary | ICD-10-CM | POA: Diagnosis not present

## 2018-02-26 DIAGNOSIS — D510 Vitamin B12 deficiency anemia due to intrinsic factor deficiency: Secondary | ICD-10-CM | POA: Diagnosis not present

## 2018-02-26 DIAGNOSIS — E1151 Type 2 diabetes mellitus with diabetic peripheral angiopathy without gangrene: Secondary | ICD-10-CM | POA: Diagnosis not present

## 2018-02-26 DIAGNOSIS — Z9079 Acquired absence of other genital organ(s): Secondary | ICD-10-CM | POA: Diagnosis not present

## 2018-02-26 DIAGNOSIS — Z8546 Personal history of malignant neoplasm of prostate: Secondary | ICD-10-CM | POA: Diagnosis not present

## 2018-02-26 DIAGNOSIS — N181 Chronic kidney disease, stage 1: Secondary | ICD-10-CM | POA: Diagnosis not present

## 2018-02-26 DIAGNOSIS — M8580 Other specified disorders of bone density and structure, unspecified site: Secondary | ICD-10-CM | POA: Diagnosis not present

## 2018-02-26 DIAGNOSIS — M1990 Unspecified osteoarthritis, unspecified site: Secondary | ICD-10-CM | POA: Diagnosis not present

## 2018-02-26 DIAGNOSIS — Z950 Presence of cardiac pacemaker: Secondary | ICD-10-CM | POA: Diagnosis not present

## 2018-02-28 DIAGNOSIS — Z950 Presence of cardiac pacemaker: Secondary | ICD-10-CM | POA: Diagnosis not present

## 2018-02-28 DIAGNOSIS — I519 Heart disease, unspecified: Secondary | ICD-10-CM | POA: Diagnosis not present

## 2018-02-28 DIAGNOSIS — I119 Hypertensive heart disease without heart failure: Secondary | ICD-10-CM | POA: Diagnosis not present

## 2018-02-28 DIAGNOSIS — C679 Malignant neoplasm of bladder, unspecified: Secondary | ICD-10-CM | POA: Diagnosis not present

## 2018-02-28 DIAGNOSIS — D510 Vitamin B12 deficiency anemia due to intrinsic factor deficiency: Secondary | ICD-10-CM | POA: Diagnosis not present

## 2018-02-28 DIAGNOSIS — D509 Iron deficiency anemia, unspecified: Secondary | ICD-10-CM | POA: Diagnosis not present

## 2018-03-02 DIAGNOSIS — D509 Iron deficiency anemia, unspecified: Secondary | ICD-10-CM | POA: Diagnosis not present

## 2018-03-02 DIAGNOSIS — D510 Vitamin B12 deficiency anemia due to intrinsic factor deficiency: Secondary | ICD-10-CM | POA: Diagnosis not present

## 2018-03-02 DIAGNOSIS — C679 Malignant neoplasm of bladder, unspecified: Secondary | ICD-10-CM | POA: Diagnosis not present

## 2018-03-02 DIAGNOSIS — Z950 Presence of cardiac pacemaker: Secondary | ICD-10-CM | POA: Diagnosis not present

## 2018-03-02 DIAGNOSIS — I519 Heart disease, unspecified: Secondary | ICD-10-CM | POA: Diagnosis not present

## 2018-03-02 DIAGNOSIS — I119 Hypertensive heart disease without heart failure: Secondary | ICD-10-CM | POA: Diagnosis not present

## 2018-03-04 DIAGNOSIS — C679 Malignant neoplasm of bladder, unspecified: Secondary | ICD-10-CM | POA: Diagnosis not present

## 2018-03-04 DIAGNOSIS — Z950 Presence of cardiac pacemaker: Secondary | ICD-10-CM | POA: Diagnosis not present

## 2018-03-04 DIAGNOSIS — I119 Hypertensive heart disease without heart failure: Secondary | ICD-10-CM | POA: Diagnosis not present

## 2018-03-04 DIAGNOSIS — D510 Vitamin B12 deficiency anemia due to intrinsic factor deficiency: Secondary | ICD-10-CM | POA: Diagnosis not present

## 2018-03-04 DIAGNOSIS — I519 Heart disease, unspecified: Secondary | ICD-10-CM | POA: Diagnosis not present

## 2018-03-04 DIAGNOSIS — D509 Iron deficiency anemia, unspecified: Secondary | ICD-10-CM | POA: Diagnosis not present

## 2018-03-07 DIAGNOSIS — I519 Heart disease, unspecified: Secondary | ICD-10-CM | POA: Diagnosis not present

## 2018-03-07 DIAGNOSIS — Z950 Presence of cardiac pacemaker: Secondary | ICD-10-CM | POA: Diagnosis not present

## 2018-03-07 DIAGNOSIS — D510 Vitamin B12 deficiency anemia due to intrinsic factor deficiency: Secondary | ICD-10-CM | POA: Diagnosis not present

## 2018-03-07 DIAGNOSIS — C679 Malignant neoplasm of bladder, unspecified: Secondary | ICD-10-CM | POA: Diagnosis not present

## 2018-03-07 DIAGNOSIS — D509 Iron deficiency anemia, unspecified: Secondary | ICD-10-CM | POA: Diagnosis not present

## 2018-03-07 DIAGNOSIS — I119 Hypertensive heart disease without heart failure: Secondary | ICD-10-CM | POA: Diagnosis not present

## 2018-03-11 DIAGNOSIS — D510 Vitamin B12 deficiency anemia due to intrinsic factor deficiency: Secondary | ICD-10-CM | POA: Diagnosis not present

## 2018-03-11 DIAGNOSIS — Z950 Presence of cardiac pacemaker: Secondary | ICD-10-CM | POA: Diagnosis not present

## 2018-03-11 DIAGNOSIS — C679 Malignant neoplasm of bladder, unspecified: Secondary | ICD-10-CM | POA: Diagnosis not present

## 2018-03-11 DIAGNOSIS — I519 Heart disease, unspecified: Secondary | ICD-10-CM | POA: Diagnosis not present

## 2018-03-11 DIAGNOSIS — D509 Iron deficiency anemia, unspecified: Secondary | ICD-10-CM | POA: Diagnosis not present

## 2018-03-11 DIAGNOSIS — I119 Hypertensive heart disease without heart failure: Secondary | ICD-10-CM | POA: Diagnosis not present

## 2018-03-14 DIAGNOSIS — D510 Vitamin B12 deficiency anemia due to intrinsic factor deficiency: Secondary | ICD-10-CM | POA: Diagnosis not present

## 2018-03-14 DIAGNOSIS — D509 Iron deficiency anemia, unspecified: Secondary | ICD-10-CM | POA: Diagnosis not present

## 2018-03-14 DIAGNOSIS — Z950 Presence of cardiac pacemaker: Secondary | ICD-10-CM | POA: Diagnosis not present

## 2018-03-14 DIAGNOSIS — C679 Malignant neoplasm of bladder, unspecified: Secondary | ICD-10-CM | POA: Diagnosis not present

## 2018-03-14 DIAGNOSIS — I119 Hypertensive heart disease without heart failure: Secondary | ICD-10-CM | POA: Diagnosis not present

## 2018-03-14 DIAGNOSIS — I519 Heart disease, unspecified: Secondary | ICD-10-CM | POA: Diagnosis not present

## 2018-03-17 DIAGNOSIS — I519 Heart disease, unspecified: Secondary | ICD-10-CM | POA: Diagnosis not present

## 2018-03-17 DIAGNOSIS — D509 Iron deficiency anemia, unspecified: Secondary | ICD-10-CM | POA: Diagnosis not present

## 2018-03-17 DIAGNOSIS — Z950 Presence of cardiac pacemaker: Secondary | ICD-10-CM | POA: Diagnosis not present

## 2018-03-17 DIAGNOSIS — C679 Malignant neoplasm of bladder, unspecified: Secondary | ICD-10-CM | POA: Diagnosis not present

## 2018-03-17 DIAGNOSIS — I119 Hypertensive heart disease without heart failure: Secondary | ICD-10-CM | POA: Diagnosis not present

## 2018-03-17 DIAGNOSIS — D510 Vitamin B12 deficiency anemia due to intrinsic factor deficiency: Secondary | ICD-10-CM | POA: Diagnosis not present

## 2018-03-21 DIAGNOSIS — D509 Iron deficiency anemia, unspecified: Secondary | ICD-10-CM | POA: Diagnosis not present

## 2018-03-21 DIAGNOSIS — Z950 Presence of cardiac pacemaker: Secondary | ICD-10-CM | POA: Diagnosis not present

## 2018-03-21 DIAGNOSIS — I119 Hypertensive heart disease without heart failure: Secondary | ICD-10-CM | POA: Diagnosis not present

## 2018-03-21 DIAGNOSIS — I519 Heart disease, unspecified: Secondary | ICD-10-CM | POA: Diagnosis not present

## 2018-03-21 DIAGNOSIS — D510 Vitamin B12 deficiency anemia due to intrinsic factor deficiency: Secondary | ICD-10-CM | POA: Diagnosis not present

## 2018-03-21 DIAGNOSIS — C679 Malignant neoplasm of bladder, unspecified: Secondary | ICD-10-CM | POA: Diagnosis not present

## 2018-03-22 DIAGNOSIS — Z950 Presence of cardiac pacemaker: Secondary | ICD-10-CM | POA: Diagnosis not present

## 2018-03-22 DIAGNOSIS — I119 Hypertensive heart disease without heart failure: Secondary | ICD-10-CM | POA: Diagnosis not present

## 2018-03-22 DIAGNOSIS — I519 Heart disease, unspecified: Secondary | ICD-10-CM | POA: Diagnosis not present

## 2018-03-22 DIAGNOSIS — D510 Vitamin B12 deficiency anemia due to intrinsic factor deficiency: Secondary | ICD-10-CM | POA: Diagnosis not present

## 2018-03-22 DIAGNOSIS — D509 Iron deficiency anemia, unspecified: Secondary | ICD-10-CM | POA: Diagnosis not present

## 2018-03-22 DIAGNOSIS — C679 Malignant neoplasm of bladder, unspecified: Secondary | ICD-10-CM | POA: Diagnosis not present

## 2018-03-24 DIAGNOSIS — I119 Hypertensive heart disease without heart failure: Secondary | ICD-10-CM | POA: Diagnosis not present

## 2018-03-24 DIAGNOSIS — D510 Vitamin B12 deficiency anemia due to intrinsic factor deficiency: Secondary | ICD-10-CM | POA: Diagnosis not present

## 2018-03-24 DIAGNOSIS — D509 Iron deficiency anemia, unspecified: Secondary | ICD-10-CM | POA: Diagnosis not present

## 2018-03-24 DIAGNOSIS — I519 Heart disease, unspecified: Secondary | ICD-10-CM | POA: Diagnosis not present

## 2018-03-24 DIAGNOSIS — C679 Malignant neoplasm of bladder, unspecified: Secondary | ICD-10-CM | POA: Diagnosis not present

## 2018-03-24 DIAGNOSIS — Z950 Presence of cardiac pacemaker: Secondary | ICD-10-CM | POA: Diagnosis not present

## 2018-03-25 DIAGNOSIS — C679 Malignant neoplasm of bladder, unspecified: Secondary | ICD-10-CM | POA: Diagnosis not present

## 2018-03-25 DIAGNOSIS — Z950 Presence of cardiac pacemaker: Secondary | ICD-10-CM | POA: Diagnosis not present

## 2018-03-25 DIAGNOSIS — I119 Hypertensive heart disease without heart failure: Secondary | ICD-10-CM | POA: Diagnosis not present

## 2018-03-25 DIAGNOSIS — R279 Unspecified lack of coordination: Secondary | ICD-10-CM | POA: Diagnosis not present

## 2018-03-25 DIAGNOSIS — R531 Weakness: Secondary | ICD-10-CM | POA: Diagnosis not present

## 2018-03-25 DIAGNOSIS — I519 Heart disease, unspecified: Secondary | ICD-10-CM | POA: Diagnosis not present

## 2018-03-25 DIAGNOSIS — D509 Iron deficiency anemia, unspecified: Secondary | ICD-10-CM | POA: Diagnosis not present

## 2018-03-25 DIAGNOSIS — D510 Vitamin B12 deficiency anemia due to intrinsic factor deficiency: Secondary | ICD-10-CM | POA: Diagnosis not present

## 2018-03-25 DIAGNOSIS — Z743 Need for continuous supervision: Secondary | ICD-10-CM | POA: Diagnosis not present

## 2018-03-26 DIAGNOSIS — I519 Heart disease, unspecified: Secondary | ICD-10-CM | POA: Diagnosis not present

## 2018-03-26 DIAGNOSIS — C679 Malignant neoplasm of bladder, unspecified: Secondary | ICD-10-CM | POA: Diagnosis not present

## 2018-03-26 DIAGNOSIS — Z950 Presence of cardiac pacemaker: Secondary | ICD-10-CM | POA: Diagnosis not present

## 2018-03-26 DIAGNOSIS — D510 Vitamin B12 deficiency anemia due to intrinsic factor deficiency: Secondary | ICD-10-CM | POA: Diagnosis not present

## 2018-03-26 DIAGNOSIS — I119 Hypertensive heart disease without heart failure: Secondary | ICD-10-CM | POA: Diagnosis not present

## 2018-03-26 DIAGNOSIS — D509 Iron deficiency anemia, unspecified: Secondary | ICD-10-CM | POA: Diagnosis not present

## 2018-03-27 DIAGNOSIS — Z950 Presence of cardiac pacemaker: Secondary | ICD-10-CM | POA: Diagnosis not present

## 2018-03-27 DIAGNOSIS — N181 Chronic kidney disease, stage 1: Secondary | ICD-10-CM | POA: Diagnosis not present

## 2018-03-27 DIAGNOSIS — Z87891 Personal history of nicotine dependence: Secondary | ICD-10-CM | POA: Diagnosis not present

## 2018-03-27 DIAGNOSIS — M8580 Other specified disorders of bone density and structure, unspecified site: Secondary | ICD-10-CM | POA: Diagnosis not present

## 2018-03-27 DIAGNOSIS — D509 Iron deficiency anemia, unspecified: Secondary | ICD-10-CM | POA: Diagnosis not present

## 2018-03-27 DIAGNOSIS — I519 Heart disease, unspecified: Secondary | ICD-10-CM | POA: Diagnosis not present

## 2018-03-27 DIAGNOSIS — M1990 Unspecified osteoarthritis, unspecified site: Secondary | ICD-10-CM | POA: Diagnosis not present

## 2018-03-27 DIAGNOSIS — D510 Vitamin B12 deficiency anemia due to intrinsic factor deficiency: Secondary | ICD-10-CM | POA: Diagnosis not present

## 2018-03-27 DIAGNOSIS — I119 Hypertensive heart disease without heart failure: Secondary | ICD-10-CM | POA: Diagnosis not present

## 2018-03-27 DIAGNOSIS — C679 Malignant neoplasm of bladder, unspecified: Secondary | ICD-10-CM | POA: Diagnosis not present

## 2018-03-27 DIAGNOSIS — E1143 Type 2 diabetes mellitus with diabetic autonomic (poly)neuropathy: Secondary | ICD-10-CM | POA: Diagnosis not present

## 2018-03-27 DIAGNOSIS — Z9079 Acquired absence of other genital organ(s): Secondary | ICD-10-CM | POA: Diagnosis not present

## 2018-03-27 DIAGNOSIS — E1151 Type 2 diabetes mellitus with diabetic peripheral angiopathy without gangrene: Secondary | ICD-10-CM | POA: Diagnosis not present

## 2018-03-27 DIAGNOSIS — Z8546 Personal history of malignant neoplasm of prostate: Secondary | ICD-10-CM | POA: Diagnosis not present

## 2018-03-28 DIAGNOSIS — C679 Malignant neoplasm of bladder, unspecified: Secondary | ICD-10-CM | POA: Diagnosis not present

## 2018-03-28 DIAGNOSIS — D510 Vitamin B12 deficiency anemia due to intrinsic factor deficiency: Secondary | ICD-10-CM | POA: Diagnosis not present

## 2018-03-28 DIAGNOSIS — I519 Heart disease, unspecified: Secondary | ICD-10-CM | POA: Diagnosis not present

## 2018-03-28 DIAGNOSIS — Z950 Presence of cardiac pacemaker: Secondary | ICD-10-CM | POA: Diagnosis not present

## 2018-03-28 DIAGNOSIS — D509 Iron deficiency anemia, unspecified: Secondary | ICD-10-CM | POA: Diagnosis not present

## 2018-03-28 DIAGNOSIS — I119 Hypertensive heart disease without heart failure: Secondary | ICD-10-CM | POA: Diagnosis not present

## 2018-03-29 DIAGNOSIS — Z950 Presence of cardiac pacemaker: Secondary | ICD-10-CM | POA: Diagnosis not present

## 2018-03-29 DIAGNOSIS — I119 Hypertensive heart disease without heart failure: Secondary | ICD-10-CM | POA: Diagnosis not present

## 2018-03-29 DIAGNOSIS — I519 Heart disease, unspecified: Secondary | ICD-10-CM | POA: Diagnosis not present

## 2018-03-29 DIAGNOSIS — C679 Malignant neoplasm of bladder, unspecified: Secondary | ICD-10-CM | POA: Diagnosis not present

## 2018-03-29 DIAGNOSIS — D509 Iron deficiency anemia, unspecified: Secondary | ICD-10-CM | POA: Diagnosis not present

## 2018-03-29 DIAGNOSIS — D510 Vitamin B12 deficiency anemia due to intrinsic factor deficiency: Secondary | ICD-10-CM | POA: Diagnosis not present

## 2018-03-30 DIAGNOSIS — I119 Hypertensive heart disease without heart failure: Secondary | ICD-10-CM | POA: Diagnosis not present

## 2018-03-30 DIAGNOSIS — C679 Malignant neoplasm of bladder, unspecified: Secondary | ICD-10-CM | POA: Diagnosis not present

## 2018-03-30 DIAGNOSIS — Z950 Presence of cardiac pacemaker: Secondary | ICD-10-CM | POA: Diagnosis not present

## 2018-03-30 DIAGNOSIS — I519 Heart disease, unspecified: Secondary | ICD-10-CM | POA: Diagnosis not present

## 2018-03-30 DIAGNOSIS — D509 Iron deficiency anemia, unspecified: Secondary | ICD-10-CM | POA: Diagnosis not present

## 2018-03-30 DIAGNOSIS — D510 Vitamin B12 deficiency anemia due to intrinsic factor deficiency: Secondary | ICD-10-CM | POA: Diagnosis not present

## 2018-03-31 DIAGNOSIS — Z950 Presence of cardiac pacemaker: Secondary | ICD-10-CM | POA: Diagnosis not present

## 2018-03-31 DIAGNOSIS — D510 Vitamin B12 deficiency anemia due to intrinsic factor deficiency: Secondary | ICD-10-CM | POA: Diagnosis not present

## 2018-03-31 DIAGNOSIS — I119 Hypertensive heart disease without heart failure: Secondary | ICD-10-CM | POA: Diagnosis not present

## 2018-03-31 DIAGNOSIS — I519 Heart disease, unspecified: Secondary | ICD-10-CM | POA: Diagnosis not present

## 2018-03-31 DIAGNOSIS — D509 Iron deficiency anemia, unspecified: Secondary | ICD-10-CM | POA: Diagnosis not present

## 2018-03-31 DIAGNOSIS — C679 Malignant neoplasm of bladder, unspecified: Secondary | ICD-10-CM | POA: Diagnosis not present

## 2018-04-01 DIAGNOSIS — C679 Malignant neoplasm of bladder, unspecified: Secondary | ICD-10-CM | POA: Diagnosis not present

## 2018-04-01 DIAGNOSIS — Z950 Presence of cardiac pacemaker: Secondary | ICD-10-CM | POA: Diagnosis not present

## 2018-04-01 DIAGNOSIS — I519 Heart disease, unspecified: Secondary | ICD-10-CM | POA: Diagnosis not present

## 2018-04-01 DIAGNOSIS — D509 Iron deficiency anemia, unspecified: Secondary | ICD-10-CM | POA: Diagnosis not present

## 2018-04-01 DIAGNOSIS — D510 Vitamin B12 deficiency anemia due to intrinsic factor deficiency: Secondary | ICD-10-CM | POA: Diagnosis not present

## 2018-04-01 DIAGNOSIS — I119 Hypertensive heart disease without heart failure: Secondary | ICD-10-CM | POA: Diagnosis not present

## 2018-04-02 DIAGNOSIS — D510 Vitamin B12 deficiency anemia due to intrinsic factor deficiency: Secondary | ICD-10-CM | POA: Diagnosis not present

## 2018-04-02 DIAGNOSIS — D509 Iron deficiency anemia, unspecified: Secondary | ICD-10-CM | POA: Diagnosis not present

## 2018-04-02 DIAGNOSIS — I519 Heart disease, unspecified: Secondary | ICD-10-CM | POA: Diagnosis not present

## 2018-04-02 DIAGNOSIS — I119 Hypertensive heart disease without heart failure: Secondary | ICD-10-CM | POA: Diagnosis not present

## 2018-04-02 DIAGNOSIS — Z950 Presence of cardiac pacemaker: Secondary | ICD-10-CM | POA: Diagnosis not present

## 2018-04-02 DIAGNOSIS — C679 Malignant neoplasm of bladder, unspecified: Secondary | ICD-10-CM | POA: Diagnosis not present

## 2018-04-27 DEATH — deceased
